# Patient Record
Sex: Female | Born: 1950 | Race: White | Hispanic: No | Marital: Married | Smoking: Never smoker
Health system: Southern US, Community
[De-identification: ages and names within clinical notes are randomized; demographics above are authoritative.]

## PROBLEM LIST (undated history)

## (undated) DIAGNOSIS — E039 Hypothyroidism, unspecified: Secondary | ICD-10-CM

## (undated) DIAGNOSIS — U071 COVID-19: Secondary | ICD-10-CM

## (undated) DIAGNOSIS — I10 Essential (primary) hypertension: Secondary | ICD-10-CM

## (undated) DIAGNOSIS — J45909 Unspecified asthma, uncomplicated: Secondary | ICD-10-CM

## (undated) DIAGNOSIS — E232 Diabetes insipidus: Secondary | ICD-10-CM

## (undated) DIAGNOSIS — E785 Hyperlipidemia, unspecified: Secondary | ICD-10-CM

## (undated) DIAGNOSIS — G4733 Obstructive sleep apnea (adult) (pediatric): Secondary | ICD-10-CM

## (undated) DIAGNOSIS — K219 Gastro-esophageal reflux disease without esophagitis: Secondary | ICD-10-CM

## (undated) DIAGNOSIS — E119 Type 2 diabetes mellitus without complications: Secondary | ICD-10-CM

## (undated) DIAGNOSIS — J449 Chronic obstructive pulmonary disease, unspecified: Secondary | ICD-10-CM

## (undated) HISTORY — DX: Essential (primary) hypertension: I10

## (undated) HISTORY — PX: OTHER SURGICAL HISTORY: SHX169

## (undated) HISTORY — DX: Hypothyroidism, unspecified: E03.9

## (undated) HISTORY — DX: Unspecified asthma, uncomplicated: J45.909

## (undated) HISTORY — DX: Gastro-esophageal reflux disease without esophagitis: K21.9

## (undated) HISTORY — DX: Obstructive sleep apnea (adult) (pediatric): G47.33

## (undated) HISTORY — DX: Type 2 diabetes mellitus without complications: E11.9

## (undated) HISTORY — PX: SHOULDER SURGERY: SHX246

## (undated) HISTORY — DX: COVID-19: U07.1

## (undated) HISTORY — DX: Diabetes insipidus: E23.2

## (undated) HISTORY — DX: Chronic obstructive pulmonary disease, unspecified: J44.9

## (undated) HISTORY — DX: Hyperlipidemia, unspecified: E78.5

---

## 2016-06-28 DIAGNOSIS — N183 Chronic kidney disease, stage 3 unspecified: Secondary | ICD-10-CM

## 2018-01-23 DIAGNOSIS — E7800 Pure hypercholesterolemia, unspecified: Secondary | ICD-10-CM

## 2018-01-23 DIAGNOSIS — K589 Irritable bowel syndrome without diarrhea: Secondary | ICD-10-CM | POA: Insufficient documentation

## 2018-01-23 DIAGNOSIS — E039 Hypothyroidism, unspecified: Secondary | ICD-10-CM | POA: Insufficient documentation

## 2018-01-23 DIAGNOSIS — G4733 Obstructive sleep apnea (adult) (pediatric): Secondary | ICD-10-CM | POA: Insufficient documentation

## 2018-01-23 DIAGNOSIS — F411 Generalized anxiety disorder: Secondary | ICD-10-CM

## 2018-01-23 DIAGNOSIS — M810 Age-related osteoporosis without current pathological fracture: Secondary | ICD-10-CM | POA: Insufficient documentation

## 2018-01-23 DIAGNOSIS — K219 Gastro-esophageal reflux disease without esophagitis: Secondary | ICD-10-CM | POA: Insufficient documentation

## 2018-01-23 HISTORY — DX: Obstructive sleep apnea (adult) (pediatric): G47.33

## 2018-02-12 DIAGNOSIS — I10 Essential (primary) hypertension: Secondary | ICD-10-CM | POA: Insufficient documentation

## 2018-02-12 HISTORY — DX: Essential (primary) hypertension: I10

## 2018-03-30 DIAGNOSIS — G8929 Other chronic pain: Secondary | ICD-10-CM | POA: Insufficient documentation

## 2018-03-30 DIAGNOSIS — E785 Hyperlipidemia, unspecified: Secondary | ICD-10-CM | POA: Insufficient documentation

## 2018-03-30 DIAGNOSIS — E232 Diabetes insipidus: Secondary | ICD-10-CM | POA: Insufficient documentation

## 2018-03-31 DIAGNOSIS — M19012 Primary osteoarthritis, left shoulder: Secondary | ICD-10-CM

## 2018-04-10 DIAGNOSIS — R0609 Other forms of dyspnea: Secondary | ICD-10-CM | POA: Insufficient documentation

## 2018-04-10 DIAGNOSIS — J330 Polyp of nasal cavity: Secondary | ICD-10-CM

## 2018-04-10 DIAGNOSIS — J4530 Mild persistent asthma, uncomplicated: Secondary | ICD-10-CM

## 2018-04-10 DIAGNOSIS — J309 Allergic rhinitis, unspecified: Secondary | ICD-10-CM

## 2018-04-10 HISTORY — DX: Other forms of dyspnea: R06.09

## 2019-01-02 DIAGNOSIS — F459 Somatoform disorder, unspecified: Secondary | ICD-10-CM | POA: Insufficient documentation

## 2019-01-23 DIAGNOSIS — I471 Supraventricular tachycardia, unspecified: Secondary | ICD-10-CM | POA: Insufficient documentation

## 2019-02-04 DIAGNOSIS — M353 Polymyalgia rheumatica: Secondary | ICD-10-CM | POA: Insufficient documentation

## 2019-03-05 DIAGNOSIS — F1320 Sedative, hypnotic or anxiolytic dependence, uncomplicated: Secondary | ICD-10-CM | POA: Insufficient documentation

## 2019-03-20 DIAGNOSIS — J449 Chronic obstructive pulmonary disease, unspecified: Secondary | ICD-10-CM | POA: Insufficient documentation

## 2020-11-27 ENCOUNTER — Other Ambulatory Visit: Payer: Self-pay

## 2020-11-27 ENCOUNTER — Ambulatory Visit (INDEPENDENT_AMBULATORY_CARE_PROVIDER_SITE_OTHER): Payer: Medicare HMO

## 2020-11-27 ENCOUNTER — Ambulatory Visit: Payer: Medicare HMO | Admitting: Internal Medicine

## 2020-11-27 VITALS — BP 126/70 | HR 80 | Temp 98.6°F | Ht 65.0 in | Wt 176.8 lb

## 2020-11-27 DIAGNOSIS — R0609 Other forms of dyspnea: Secondary | ICD-10-CM

## 2020-11-27 DIAGNOSIS — R0600 Dyspnea, unspecified: Secondary | ICD-10-CM

## 2020-11-27 LAB — CBC WITH DIFFERENTIAL/PLATELET
Basophils Absolute: 0 10*3/uL (ref 0.0–0.1)
Basophils Relative: 0.8 % (ref 0.0–3.0)
Eosinophils Absolute: 0 10*3/uL (ref 0.0–0.7)
Eosinophils Relative: 1 % (ref 0.0–5.0)
HCT: 37.7 % (ref 36.0–46.0)
Hemoglobin: 12.2 g/dL (ref 12.0–15.0)
Lymphocytes Relative: 20.8 % (ref 12.0–46.0)
Lymphs Abs: 1 10*3/uL (ref 0.7–4.0)
MCHC: 32.5 g/dL (ref 30.0–36.0)
MCV: 87 fl (ref 78.0–100.0)
Monocytes Absolute: 0.5 10*3/uL (ref 0.1–1.0)
Monocytes Relative: 9.3 % (ref 3.0–12.0)
Neutro Abs: 3.4 10*3/uL (ref 1.4–7.7)
Neutrophils Relative %: 68.1 % (ref 43.0–77.0)
Platelets: 367 10*3/uL (ref 150.0–400.0)
RBC: 4.33 Mil/uL (ref 3.87–5.11)
RDW: 15.7 % — ABNORMAL HIGH (ref 11.5–15.5)
WBC: 5 10*3/uL (ref 4.0–10.5)

## 2020-11-27 LAB — BASIC METABOLIC PANEL
BUN: 9 mg/dL (ref 6–23)
CO2: 27 mEq/L (ref 19–32)
Calcium: 9.4 mg/dL (ref 8.4–10.5)
Chloride: 98 mEq/L (ref 96–112)
Creatinine, Ser: 0.77 mg/dL (ref 0.40–1.20)
GFR: 77.45 mL/min (ref >60.00)
Glucose, Bld: 81 mg/dL (ref 70–99)
Potassium: 4.4 mEq/L (ref 3.5–5.1)
Sodium: 133 mEq/L — ABNORMAL LOW (ref 135–145)

## 2020-11-27 LAB — BRAIN NATRIURETIC PEPTIDE: Pro B Natriuretic peptide (BNP): 32 pg/mL (ref 0.0–100.0)

## 2020-11-27 LAB — SEDIMENTATION RATE: Sed Rate: 14 mm/hr (ref 0–30)

## 2020-11-27 LAB — TSH: TSH: 1.85 u[IU]/mL (ref 0.35–5.50)

## 2020-11-27 NOTE — Progress Notes (Signed)
Kayla Solomon, female    DOB: 01/01/1951,   MRN: [id number]   Brief patient profile:  70 yo  wf   never smoker  referred to pulmonary clinic 11/27/2020 by Loura Back NP for asthma eval.  Seasonal allergy around age 57 with nasal polyps /surgery 1995 [place]  and asthma in her 66's while living in [place] > dx by pulmonary as asthma but says rx trelegy helped some there but since in [facility] March 2022  breathing worse to point of sob at rest.    History of Present Illness  11/27/2020  Pulmonary/ 1st office eval/  Chief Complaint  Patient presents with   Consult    Having sob some difficult breathing.   Dyspnea:  food lion p 2 aisles  Cough: not now  Sleep: tilted up at 3-4 pillows and cpap  SABA use: not working  Ent eval Dr Suszanne Conners no change rx per pt   No obvious day to day or daytime variability or assoc excess/ purulent sputum or mucus plugs or hemoptysis or cp or chest tightness, subjective wheeze or overt sinus or hb symptoms.   Sleeping as above without nocturnal  or early am exacerbation  of respiratory  c/o's or need for noct saba. Also denies any obvious fluctuation of symptoms with weather or environmental changes or other aggravating or alleviating factors except as outlined above   No unusual exposure hx or h/o childhood pna/ asthma or knowledge of premature birth.  Current Allergies, Complete Past Medical History, Past Surgical History, Family History, and Social History were reviewed in [employer] record.  ROS  The following are not active complaints unless bolded Hoarseness, sore throat, dysphagia, dental problems, itching, sneezing,  nasal congestion or discharge of excess mucus or purulent secretions, ear ache,   fever, chills, sweats, unintended wt loss or wt gain, classically pleuritic or exertional cp,  orthopnea pnd or arm/hand swelling  or leg swelling, presyncope, palpitations, abdominal pain, anorexia, nausea, vomiting, diarrhea  or change  in bowel habits or change in bladder habits, change in stools or change in urine, dysuria, hematuria,  rash, arthralgias, visual complaints, headache, numbness, weakness or ataxia or problems with walking or coordination,  change in mood or  memory.           Past Medical History:  Diagnosis Date   Asthma    COPD (chronic obstructive pulmonary disease) (HCC)    Diabetes mellitus without complication (HCC)    GERD (gastroesophageal reflux disease)    Hyperlipidemia    Hypertension    Hypothyroidism    OSA (obstructive sleep apnea)     Outpatient Medications Prior to Visit  Medication Sig Dispense Refill   ALPRAZolam (XANAX) 0.5 MG tablet Take 0.5 mg by mouth at bedtime as needed for anxiety.     amLODipine (NORVASC) 5 MG tablet Take 5 mg by mouth daily.     atorvastatin (LIPITOR) 20 MG tablet Take 20 mg by mouth daily.     desmopressin (DDAVP) 0.1 MG tablet Take 0.1 mg by mouth 2 (two) times daily.     FLUoxetine (PROZAC) 40 MG capsule Take 40 mg by mouth daily.     Fluticasone-Umeclidin-Vilant (TRELEGY ELLIPTA) 100-62.5-25 MCG/INH AEPB Inhale 1 puff into the lungs daily.     levothyroxine (SYNTHROID) 112 MCG tablet Take 112 mcg by mouth daily before breakfast.     montelukast (SINGULAIR) 10 MG tablet Take 10 mg by mouth at bedtime.     nitroGLYCERIN (NITROSTAT) 0.4 MG  SL tablet Place 0.4 mg under the tongue every 5 (five) minutes as needed for chest pain.     pantoprazole (PROTONIX) 40 MG tablet Take 40 mg by mouth daily.     tizanidine (ZANAFLEX) 2 MG capsule Take 2 mg by mouth daily.     traZODone (DESYREL) 100 MG tablet Take 100 mg by mouth at bedtime.     albuterol (PROVENTIL HFA) 108 (90 Base) MCG/ACT inhaler Inhale 2 puffs into the lungs every 6 (six) hours as needed for wheezing or shortness of breath.     linaclotide (LINZESS) 145 MCG CAPS capsule Take 145 mcg by mouth daily before breakfast. (Patient not taking: Reported on 11/27/2020)     No facility-administered medications  prior to visit.     Objective:     BP 126/70   Pulse 80   Temp 98.6 F (37 C) (Oral)   Ht 5\' 5"  (1.651 m)   Wt 176 lb 12.8 oz (80.2 kg)   SpO2 97% Comment: ra  BMI 29.42 kg/m   SpO2: 97 % (ra)  Amb female nad / mild pseudowheeze    HEENT : pt wearing mask not removed for exam due to covid -19 concerns.    NECK :  without JVD/Nodes/TM/ nl carotid upstrokes bilaterally   LUNGS: no acc muscle use,  Nl contour chest which is clear to A and P bilaterally without cough on insp or exp maneuvers   CV:  RRR  no s3 or murmur or increase in P2, and no edema   ABD:  soft and nontender with nl inspiratory excursion in the supine position. No bruits or organomegaly appreciated, bowel sounds nl  MS:  Nl gait/ ext warm without deformities, calf tenderness, cyanosis or clubbing No obvious joint restrictions   SKIN: warm and dry without lesions    NEURO:  alert, approp, nl sensorium with  no motor or cerebellar deficits apparent.    CXR PA and Lateral:   11/27/2020 :    I personally reviewed images / impression as follows:    Cm s acute lung changes   Labs ordered/ reviewed:      Chemistry      Component Value Date/Time   NA 133 (L) 11/27/2020 1215   K 4.4 11/27/2020 1215   CL 98 11/27/2020 1215   CO2 27 11/27/2020 1215   BUN 9 11/27/2020 1215   CREATININE 0.77 11/27/2020 1215      Component Value Date/Time   CALCIUM 9.4 11/27/2020 1215        Lab Results  Component Value Date   WBC 5.0 11/27/2020   HGB 12.2 11/27/2020   HCT 37.7 11/27/2020   MCV 87.0 11/27/2020   PLT 367.0 11/27/2020     Lab Results  Component Value Date   DDIMER 0.34 11/27/2020      Lab Results  Component Value Date   TSH 1.85 11/27/2020     Lab Results  Component Value Date   PROBNP 32.0 11/27/2020       Lab Results  Component Value Date   ESRSEDRATE 14 11/27/2020            Assessment   DOE (dyspnea on exertion) Onset in her 60's in setting of h/o nasal polyps dx  as asthma some better on trelegy  -  11/27/2020   [facility] RA 3 laps @ approx 252ft each @ avg pace  stopped due to sob with sats no lower than 95%  -  11/27/2020  After extensive  coaching inhaler device,  effectiveness =   50% from a baseline near 0  Allergy profile 11/27/2020 >  Eos 0.0 /  IgE  Pending  Symptoms are markedly disproportionate to objective findings and not clear to what extent this is actually a pulmonary  problem but pt does appear to have difficult to sort out respiratory symptoms of unknown origin for which  DDX  = almost all start with A and  include Adherence, Ace Inhibitors, Acid Reflux, Active Sinus Disease, Alpha 1 Antitripsin deficiency, Anxiety masquerading as Airways dz,  ABPA,  Allergy(esp in young), Aspiration (esp in elderly), Adverse effects of meds,  Active smoking or Vaping, A bunch of PE's/clot burden (a few small clots can't cause this syndrome unless there is already severe underlying pulm or vascular dz with poor reserve),  Anemia or thyroid disorder, plus two Bs  = Bronchiectasis and Beta blocker use..and one C= CHF     Adherence is always the initial "prime suspect" and is a multilayered concern that requires a "trust but verify" approach in every patient - starting with knowing how to use medications, especially inhalers, correctly, keeping up with refills and understanding the fundamental difference between maintenance and prns vs those medications only taken for a very short course and then stopped and not refilled.  - see hfa teaching   ? Acid (or non-acid) GERD > always difficult to exclude as up to 75% of pts in some series report no assoc GI/ Heartburn symptoms> rec max (24h)  acid suppression and diet restrictions/ reviewed and instructions given in writing.   ? Adverse side effects of meds, in particular trelegy, with exam suggestive of pseudowheeze only > trial off and restart right away if getting worse ? Allergy/ asthma in setting of h/o nasal polyps.   -  nothing to suggest refractory asthma here. Will try off trelegy  As above and just use singulair and  prn saba I spent extra time with pt today reviewing appropriate use of albuterol for prn use on exertion with the following points: 1) saba is for relief of sob that does not improve by walking a slower pace or resting but rather if the pt does not improve after trying this first. 2) If the pt is convinced, as many are, that saba helps recover from activity faster then it's easy to tell if this is the case by re-challenging : ie stop, take the inhaler, then p 5 minutes try the exact same activity (intensity of workload) that just caused the symptoms and see if they are substantially diminished or not after saba 3) if there is an activity that reproducibly causes the symptoms, try the saba 15 min before the activity on alternate days   If in fact the saba really does help, then fine to continue to use it prn but advised may need to look closer at the maintenance regimen being used to achieve better control of airways disease with exertion.    ? Anemia / thyroid disoder > ruled out  ? Anxiety/depression/ deconditioning  > usually at the bottom of this list of usual suspects but   note already on psychotropics and may interfere with adherence and also interpretation of response or lack thereof to symptom management which can be quite subjective.   ?  A bunch of PE's > D dimer nl - while a normal  or high normal value (seen commonly in the elderly or chronically ill)  may miss small peripheral pe, the clot burden with  sob is moderately high and the d dimer  has a very high neg pred value if used in this setting.    ? chf > excluded by bnp so low though does have mild CM on cxr    Each maintenance medication was reviewed in detail including emphasizing most importantly the difference between maintenance and prns and under what circumstances the prns are to be triggered using an action plan format where  appropriate.  Total time for H and P, chart review, counseling, reviewing hfa device(s) , directly observing portions of ambulatory 02 saturation study/ and generating customized AVS unique to this office visit / same day charting =51 min                   Sandrea Hughs, MD 11/27/2020

## 2020-11-27 NOTE — Patient Instructions (Addendum)
Only use your albuterol as a rescue medication to be used if you can't catch your breath by resting or doing a relaxed purse lip breathing pattern.  - The less you use it, the better it will work when you need it. - Ok to use up to 2 puffs  every 4 hours if you must but call for immediate appointment if use goes up over your usual need - Don't leave home without it !!  (think of it like the spare tire for your car)   Also ok to Try albuterol 15 min before an activity (on alternating days)  that you know would make you short of breath and see if it makes any difference and if makes none then don't take albuterol after activity unless you can't catch your breath as this means it's the resting that helps, not the albuterol.      Work on inhaler technique:  relax and gently blow all the way out then take a nice smooth full deep breath back in, triggering the inhaler at same time you start breathing in.   Blow out thru nose. Rinse and gargle with water when done.  If mouth or throat bother you at all,  try brushing teeth/gums/tongue with arm and hammer toothpaste/ make a slurry and gargle and spit out.   Ok to leave off trelegy  Please remember to go to the lab and x-ray department  for your tests - we will call you with the results when they are available.       Please schedule a follow up office visit in 6 weeks in 6 weeks -no inhalers that day

## 2020-11-29 ENCOUNTER — Encounter: Payer: Self-pay | Admitting: Internal Medicine

## 2020-11-29 NOTE — Assessment & Plan Note (Addendum)
Onset in her 60's in setting of h/o nasal polyps dx as asthma some better on trelegy  -  11/27/2020   [facility] RA 3 laps @ approx 254ft each @ avg pace  stopped due to sob with sats no lower than 95%  -  11/27/2020  After extensive coaching inhaler device,  effectiveness =   50% from a baseline near 0  Allergy profile 11/27/2020 >  Eos 0.0 /  IgE  Pending  Symptoms are markedly disproportionate to objective findings and not clear to what extent this is actually a pulmonary  problem but pt does appear to have difficult to sort out respiratory symptoms of unknown origin for which  DDX  = almost all start with A and  include Adherence, Ace Inhibitors, Acid Reflux, Active Sinus Disease, Alpha 1 Antitripsin deficiency, Anxiety masquerading as Airways dz,  ABPA,  Allergy(esp in young), Aspiration (esp in elderly), Adverse effects of meds,  Active smoking or Vaping, A bunch of PE's/clot burden (a few small clots can't cause this syndrome unless there is already severe underlying pulm or vascular dz with poor reserve),  Anemia or thyroid disorder, plus two Bs  = Bronchiectasis and Beta blocker use..and one C= CHF     Adherence is always the initial "prime suspect" and is a multilayered concern that requires a "trust but verify" approach in every patient - starting with knowing how to use medications, especially inhalers, correctly, keeping up with refills and understanding the fundamental difference between maintenance and prns vs those medications only taken for a very short course and then stopped and not refilled.  - see hfa teaching   ? Acid (or non-acid) GERD > always difficult to exclude as up to 75% of pts in some series report no assoc GI/ Heartburn symptoms> rec max (24h)  acid suppression and diet restrictions/ reviewed and instructions given in writing.   ? Adverse side effects of meds, in particular trelegy, with exam suggestive of pseudowheeze only > trial off and restart right away if getting worse ?  Allergy/ asthma in setting of h/o nasal polyps.   - nothing to suggest refractory asthma here. Will try off trelegy  As above and just use singulair and  prn saba I spent extra time with pt today reviewing appropriate use of albuterol for prn use on exertion with the following points: 1) saba is for relief of sob that does not improve by walking a slower pace or resting but rather if the pt does not improve after trying this first. 2) If the pt is convinced, as many are, that saba helps recover from activity faster then it's easy to tell if this is the case by re-challenging : ie stop, take the inhaler, then p 5 minutes try the exact same activity (intensity of workload) that just caused the symptoms and see if they are substantially diminished or not after saba 3) if there is an activity that reproducibly causes the symptoms, try the saba 15 min before the activity on alternate days   If in fact the saba really does help, then fine to continue to use it prn but advised may need to look closer at the maintenance regimen being used to achieve better control of airways disease with exertion.    ? Anemia / thyroid disoder > ruled out  ? Anxiety/depression/ deconditioning  > usually at the bottom of this list of usual suspects but   note already on psychotropics and may interfere with adherence and also interpretation  of response or lack thereof to symptom management which can be quite subjective.   ?  A bunch of PE's > D dimer nl - while a normal  or high normal value (seen commonly in the elderly or chronically ill)  may miss small peripheral pe, the clot burden with sob is moderately high and the d dimer  has a very high neg pred value if used in this setting.    ? chf > excluded by bnp so low though does have mild CM on cxr    Each maintenance medication was reviewed in detail including emphasizing most importantly the difference between maintenance and prns and under what circumstances the prns  are to be triggered using an action plan format where appropriate.  Total time for H and P, chart review, counseling, reviewing hfa device(s) , directly observing portions of ambulatory 02 saturation study/ and generating customized AVS unique to this office visit / same day charting =51 min

## 2020-11-30 ENCOUNTER — Encounter: Payer: Self-pay | Admitting: *Deleted

## 2020-11-30 LAB — IGE: IgE (Immunoglobulin E), Serum: 16 kU/L (ref <=114)

## 2020-11-30 LAB — ALPHA-1-ANTITRYPSIN: A-1 Antitrypsin, Ser: 169 mg/dL (ref 83–199)

## 2020-11-30 LAB — D-DIMER, QUANTITATIVE: D-Dimer, Quant: 0.34 mcg/mL FEU (ref <0.50)

## 2020-12-01 ENCOUNTER — Encounter: Payer: Self-pay | Admitting: *Deleted

## 2020-12-28 NOTE — Progress Notes (Signed)
Cardiology Office Note:   Date:  12/29/2020  NAME:  Kayla Solomon    MRN: [id number] DOB:  12/03/1950   PCP:  Loura Back, NP  Cardiologist:  None  Electrophysiologist:  None   Referring MD: Loura Back, NP   Chief Complaint  Patient presents with   Fatigue    History of Present Illness:   Kayla Solomon is a 70 y.o. female with a hx of asthma, DM, GERD, HTN, HLD who is being seen today for the evaluation of fatigue at the request of Loura Back, NP.  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  She also has sleep apnea and hypertension.  She recently relocated to [facility] from [place].  She apparently recently lost a daughter.  Her oldest daughter died in 2022/08/08 of this year.  They moved to [place] shortly after.  She reports it has been tough and she is likely depressed.  She informs me she has no chest pain or pressure.  She is not active.  Her EKG demonstrates sinus rhythm with nonspecific ST-T changes.  She has never had a heart attack or stroke.  She has followed with cardiology in [place] in the past.  She underwent an echocardiogram in 2020/04/15that showed an LV EF of 55-60% with grade 1 diastolic dysfunction.  She had a stress test in 2018 that demonstrated a small fixed inferior defect that was considered low risk and likely artifact.  She informs me she is not exercising.  Her blood pressure is well controlled.  She is a never smoker.  She does not drink alcohol or use drugs.  She also informs me that she has another daughter who lives with her and her husband.  Apparently this child has mental illness.  Apparently it is quite stressful taking care of her.  She is not sleeping well either.  She reports no appetite.  I suspect her symptoms are likely related to depression.  Her primary care physician has requested a cardiac evaluation which is quite reasonable.  She does report a strong family history of heart disease in her father as well as maternal uncles.  Her symptoms of  fatigue appear to have occurred since 04-15-22to coincide with the death of her daughter.  Low activity but reports no chest pain or shortness of breath in office today.  Problem List Asthma GERD HTN HLD -T chol 159, HDL 55, LDL 85, TG 98 Diabetes insipidus  OSA  Past Medical History: Past Medical History:  Diagnosis Date   Asthma    COPD (chronic obstructive pulmonary disease) (HCC)    Diabetes insipidus (HCC)    Diabetes mellitus without complication (HCC)    GERD (gastroesophageal reflux disease)    Hyperlipidemia    Hypertension    Hypothyroidism    OSA (obstructive sleep apnea)     Past Surgical History: Past Surgical History:  Procedure Laterality Date   SHOULDER SURGERY     ulnar surgery      Current Medications: Current Meds  Medication Sig   ALPRAZolam (XANAX) 0.5 MG tablet Take 0.5 mg by mouth at bedtime as needed for anxiety.   amLODipine (NORVASC) 5 MG tablet Take 5 mg by mouth daily.   atorvastatin (LIPITOR) 20 MG tablet Take 20 mg by mouth daily.   desmopressin (DDAVP) 0.1 MG tablet Take 0.1 mg by mouth 2 (two) times daily.   FLUoxetine (PROZAC) 40 MG capsule Take 40 mg by mouth daily.   levothyroxine (SYNTHROID) 112 MCG tablet  Take 112 mcg by mouth daily before breakfast.   montelukast (SINGULAIR) 10 MG tablet Take 10 mg by mouth at bedtime.   nitroGLYCERIN (NITROSTAT) 0.4 MG SL tablet Place 0.4 mg under the tongue every 5 (five) minutes as needed for chest pain.   pantoprazole (PROTONIX) 40 MG tablet Take 40 mg by mouth daily.   tizanidine (ZANAFLEX) 2 MG capsule Take 2 mg by mouth daily.   traZODone (DESYREL) 100 MG tablet Take 100 mg by mouth at bedtime.     Allergies:    Patient has no known allergies.   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  Tobacco Use   Smoking status: Never   Smokeless tobacco: Never  Substance and Sexual Activity   Alcohol use: Not Currently   Drug use: Never   Sexual activity: Not Currently  Other Topics Concern   Not on file  Social History Narrative   Not on file   Social Determinants of Health   Financial Resource Strain: Not on file  Food Insecurity: Not on file  Transportation Needs: Not on file  Physical Activity: Not on file  Stress: Not on file  Social Connections: Not on file     Family History: The patient's family history includes Heart attack in her maternal aunt; Heart attack (age of onset: 63) in her father; Stroke in her maternal uncle.  ROS:   All other ROS reviewed and negative. Pertinent positives noted in the HPI.     EKGs/Labs/Other Studies Reviewed:   The following studies were personally reviewed by me today:  EKG:  EKG is ordered today.  The ekg ordered today demonstrates sinus bradycardia heart rate 52, anterior T wave inversions noted, and was personally reviewed by me.   Recent Labs: 11/27/2020: BUN 9; Creatinine, Ser 0.77; Hemoglobin 12.2; Platelets 367.0; Potassium 4.4; Pro B Natriuretic peptide (BNP) 32.0; Sodium 133; TSH 1.85   Recent Lipid Panel No results found for: CHOL, TRIG, HDL, CHOLHDL, VLDL, LDLCALC, LDLDIRECT  Physical Exam:   VS:  BP 122/76   Pulse (!) 52   Ht 5\' 5"  (1.651 m)   Wt 178 lb 12.8 oz (81.1 kg)   SpO2 98%   BMI 29.75 kg/m    Wt Readings from Last 3 Encounters:  12/29/20 178 lb 12.8 oz (81.1 kg)  11/27/20 176 lb 12.8 oz (80.2 kg)    General: Well nourished, well developed, in no acute distress Head: Atraumatic, normal size  Eyes: PEERLA, EOMI  Neck: Supple, no JVD Endocrine: No thryomegaly Cardiac: Normal S1, S2; RRR; no murmurs, rubs, or gallops Lungs: Clear to auscultation bilaterally, no wheezing, rhonchi or rales  Abd: Soft, nontender, no hepatomegaly  Ext: No edema, pulses  2+ Musculoskeletal: No deformities, BUE and BLE strength normal and equal Skin: Warm and dry, no rashes   Neuro: Alert and oriented to person, place, time, and situation, CNII-XII grossly intact, no focal deficits  Psych: Normal mood and affect   ASSESSMENT:   Kayla Solomon is a 70 y.o. female who presents for the following: 1. Chronic fatigue     PLAN:   1. Chronic fatigue -reports fatigue and poor sleep.  This is coincided with the death of her daughter in Jul 29, 2020.  She also cares for her other daughter who has mental illness.  She reports it is tough and stressful.  She is had a stress test and echocardiogram in [place] that were normal.  Her EKG today in office demonstrates sinus bradycardia with nonspecific ST-T changes.  She informs me she has no angina or chest pressure.  She did report this with her primary care physician.  Really unclear if she has a history of ischemia.  Her stress test shows no evidence of this and I suspect this is artifact from the review of records and [place]. -Although I suspect her symptoms are related to depression, I would recommend we exclude any coronary disease with a Lexi nuclear medicine stress.  We will combine this with a coronary calcium score.  This will help further risk stratify her.  We will plan to see her yearly and I will notify her of her results by phone.  Shared Decision Making/Informed Consent The risks [chest pain, shortness of breath, cardiac arrhythmias, dizziness, blood pressure fluctuations, myocardial infarction, stroke/transient ischemic attack, nausea, vomiting, allergic reaction, radiation exposure, metallic taste sensation and life-threatening complications (estimated to be 1 in 10,000)], benefits (risk stratification, diagnosing coronary artery disease, treatment guidance) and alternatives of a nuclear stress test were discussed in detail with Ms. Stancil and she agrees to proceed.  Disposition: Return in about 1 year (around  12/29/2021).  Medication Adjustments/Labs and Tests Ordered: Current medicines are reviewed at length with the patient today.  Concerns regarding medicines are outlined above.  Orders Placed This Encounter  Procedures   CT CARDIAC SCORING (SELF PAY ONLY)   Cardiac Stress Test: Informed Consent Details: Physician/Practitioner Attestation; Transcribe to consent form and obtain patient signature   MYOCARDIAL PERFUSION IMAGING   EKG 12-Lead    No orders of the defined types were placed in this encounter.   Patient Instructions  Medication Instructions:  The current medical regimen is effective;  continue present plan and medications.  *If you need a refill on your cardiac medications before your next appointment, please call your pharmacy*   Testing/Procedures: Your physician has requested that you have a lexiscan myoview. For further information please visit [email or web address] Please follow instruction sheet, as given.  CALCIUM SCORE  Follow-Up: At [facility], you and your health needs are our priority.  As part of our continuing mission to provide you with exceptional heart care, we have created designated Provider Care Teams.  These Care Teams include your primary Cardiologist (physician) and Advanced Practice Providers (APPs -  Physician Assistants and Nurse Practitioners) who all work together to provide you with the care you need, when you need it.  We recommend signing up for the patient portal called "MyChart".  Sign up information is provided on this After Visit Summary.  MyChart is used to connect with patients for Virtual Visits (Telemedicine).  Patients are able to view lab/test results, encounter notes, upcoming appointments, etc.  Non-urgent messages can be sent to your provider as well.   To learn more about what you can do with MyChart, go to [email or web address].    Your next appointment:   12 month(s)  The format for your next appointment:   In  Person  Provider:   Lennie Odor, MD      Signed, Lenna Gilford. Flora Lipps, MD, [facility]  [facility]  [address] [place], [place] [address] [phone]  12/29/2020 3:58 PM

## 2020-12-29 ENCOUNTER — Ambulatory Visit: Payer: Medicare HMO | Admitting: Cardiovascular Disease

## 2020-12-29 ENCOUNTER — Encounter: Payer: Self-pay | Admitting: Cardiovascular Disease

## 2020-12-29 ENCOUNTER — Other Ambulatory Visit: Payer: Self-pay

## 2020-12-29 VITALS — BP 122/76 | HR 52 | Ht 65.0 in | Wt 178.8 lb

## 2020-12-29 DIAGNOSIS — R5382 Chronic fatigue, unspecified: Secondary | ICD-10-CM | POA: Diagnosis not present

## 2020-12-29 DIAGNOSIS — R072 Precordial pain: Secondary | ICD-10-CM

## 2020-12-29 NOTE — Patient Instructions (Signed)
Medication Instructions:  The current medical regimen is effective;  continue present plan and medications.  *If you need a refill on your cardiac medications before your next appointment, please call your pharmacy*   Testing/Procedures: Your physician has requested that you have a lexiscan myoview. For further information please visit [email or web address] Please follow instruction sheet, as given.  CALCIUM SCORE  Follow-Up: At [facility], you and your health needs are our priority.  As part of our continuing mission to provide you with exceptional heart care, we have created designated Provider Care Teams.  These Care Teams include your primary Cardiologist (physician) and Advanced Practice Providers (APPs -  Physician Assistants and Nurse Practitioners) who all work together to provide you with the care you need, when you need it.  We recommend signing up for the patient portal called "MyChart".  Sign up information is provided on this After Visit Summary.  MyChart is used to connect with patients for Virtual Visits (Telemedicine).  Patients are able to view lab/test results, encounter notes, upcoming appointments, etc.  Non-urgent messages can be sent to your provider as well.   To learn more about what you can do with MyChart, go to [email or web address].    Your next appointment:   12 month(s)  The format for your next appointment:   In Person  Provider:   Lennie Odor, MD

## 2020-12-31 ENCOUNTER — Encounter: Payer: Self-pay | Admitting: Physical Therapy

## 2020-12-31 ENCOUNTER — Other Ambulatory Visit: Payer: Self-pay

## 2020-12-31 ENCOUNTER — Ambulatory Visit: Payer: Medicare HMO | Attending: Neurology | Admitting: Physical Therapy

## 2020-12-31 DIAGNOSIS — R42 Dizziness and giddiness: Secondary | ICD-10-CM | POA: Diagnosis present

## 2020-12-31 DIAGNOSIS — R2681 Unsteadiness on feet: Secondary | ICD-10-CM | POA: Diagnosis present

## 2020-12-31 DIAGNOSIS — R262 Difficulty in walking, not elsewhere classified: Secondary | ICD-10-CM | POA: Diagnosis present
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
## 2021-01-01 ENCOUNTER — Telehealth (HOSPITAL_COMMUNITY): Payer: Self-pay | Admitting: *Deleted

## 2021-01-01 NOTE — Telephone Encounter (Signed)
Close encounter 

## 2021-01-06 ENCOUNTER — Ambulatory Visit (HOSPITAL_COMMUNITY)
Admission: RE | Admit: 2021-01-06 | Discharge: 2021-01-06 | Disposition: A | Discharge status: Home / Self Care | Payer: Medicare HMO | Source: Ambulatory Visit | Attending: Cardiovascular Disease | Admitting: Cardiovascular Disease

## 2021-01-06 ENCOUNTER — Other Ambulatory Visit: Payer: Self-pay

## 2021-01-06 DIAGNOSIS — R5382 Chronic fatigue, unspecified: Secondary | ICD-10-CM

## 2021-01-06 DIAGNOSIS — I10 Essential (primary) hypertension: Secondary | ICD-10-CM | POA: Insufficient documentation

## 2021-01-06 DIAGNOSIS — E039 Hypothyroidism, unspecified: Secondary | ICD-10-CM | POA: Diagnosis not present

## 2021-01-06 DIAGNOSIS — I2510 Atherosclerotic heart disease of native coronary artery without angina pectoris: Secondary | ICD-10-CM | POA: Diagnosis not present

## 2021-01-06 DIAGNOSIS — R001 Bradycardia, unspecified: Secondary | ICD-10-CM | POA: Diagnosis not present

## 2021-01-06 DIAGNOSIS — R0609 Other forms of dyspnea: Secondary | ICD-10-CM | POA: Insufficient documentation

## 2021-01-06 DIAGNOSIS — J449 Chronic obstructive pulmonary disease, unspecified: Secondary | ICD-10-CM | POA: Insufficient documentation

## 2021-01-06 DIAGNOSIS — G4733 Obstructive sleep apnea (adult) (pediatric): Secondary | ICD-10-CM | POA: Insufficient documentation

## 2021-01-06 DIAGNOSIS — I252 Old myocardial infarction: Secondary | ICD-10-CM | POA: Diagnosis not present

## 2021-01-06 DIAGNOSIS — Z8249 Family history of ischemic heart disease and other diseases of the circulatory system: Secondary | ICD-10-CM | POA: Diagnosis not present

## 2021-01-06 LAB — MYOCARDIAL PERFUSION IMAGING
LV dias vol: 100 mL (ref 46–106)
LV sys vol: 26 mL
Nuc Stress EF: 74 %
Peak HR: 76 {beats}/min
Rest HR: 53 {beats}/min
Rest Nuclear Isotope Dose: 10.2 mCi
SDS: 0
SRS: 1
SSS: 1
ST Depression (mm): 0 mm
Stress Nuclear Isotope Dose: 28.7 mCi
TID: 1.04

## 2021-01-06 MED ORDER — TECHNETIUM TC 99M TETROFOSMIN IV KIT
10.2000 | PACK | Freq: Once | INTRAVENOUS | Status: AC | PRN
  Administered 2021-01-06: 10.2 via INTRAVENOUS
  Filled 2021-01-06: qty 11

## 2021-01-06 MED ORDER — REGADENOSON 0.4 MG/5ML IV SOLN
0.4000 mg | Freq: Once | INTRAVENOUS | Status: AC
  Administered 2021-01-06: 0.4 mg via INTRAVENOUS

## 2021-01-06 MED ORDER — AMINOPHYLLINE 25 MG/ML IV SOLN
75.0000 mg | Freq: Once | INTRAVENOUS | Status: AC
  Administered 2021-01-06: 75 mg via INTRAVENOUS

## 2021-01-06 MED ORDER — TECHNETIUM TC 99M TETROFOSMIN IV KIT
28.7000 | PACK | Freq: Once | INTRAVENOUS | Status: AC | PRN
  Administered 2021-01-06: 28.7 via INTRAVENOUS
  Filled 2021-01-06: qty 29

## 2021-01-12 ENCOUNTER — Ambulatory Visit (INDEPENDENT_AMBULATORY_CARE_PROVIDER_SITE_OTHER)
Admission: RE | Admit: 2021-01-12 | Discharge: 2021-01-12 | Disposition: A | Discharge status: Home / Self Care | Payer: Self-pay | Source: Ambulatory Visit | Attending: Cardiovascular Disease | Admitting: Cardiovascular Disease

## 2021-01-12 ENCOUNTER — Other Ambulatory Visit: Payer: Self-pay

## 2021-01-12 DIAGNOSIS — R5382 Chronic fatigue, unspecified: Secondary | ICD-10-CM

## 2021-01-14 ENCOUNTER — Other Ambulatory Visit: Payer: Self-pay

## 2021-01-14 ENCOUNTER — Ambulatory Visit (INDEPENDENT_AMBULATORY_CARE_PROVIDER_SITE_OTHER): Payer: Medicare HMO | Admitting: Pulmonary Disease

## 2021-01-14 ENCOUNTER — Encounter: Payer: Self-pay | Admitting: Pulmonary Disease

## 2021-01-14 DIAGNOSIS — G4733 Obstructive sleep apnea (adult) (pediatric): Secondary | ICD-10-CM | POA: Diagnosis not present

## 2021-01-14 DIAGNOSIS — R0600 Dyspnea, unspecified: Secondary | ICD-10-CM | POA: Diagnosis not present

## 2021-01-14 DIAGNOSIS — Z9989 Dependence on other enabling machines and devices: Secondary | ICD-10-CM

## 2021-01-14 DIAGNOSIS — R0609 Other forms of dyspnea: Secondary | ICD-10-CM

## 2021-01-14 NOTE — Patient Instructions (Signed)
  We will try to track down-your-old sleep study We will send in a prescription for auto CPAP 5 to 15 cm to DME Prescription for new nasal mask

## 2021-01-14 NOTE — Progress Notes (Signed)
Subjective:    Patient ID: Kayla Solomon, female    DOB: 1950-11-06, 70 y.o.   MRN: [id number]  HPI  Chief Complaint  Patient presents with   Consult    OSA x 10 years.  Last sleep study in 2019.  Needs a new machine.  Machine still currently works, but is not in good shape.  Headgear is breaking down.  Has a nasal mask.   70 year old never smoker with asthma referred for evaluation of obstructive sleep apnea  Seasonal allergy around age 70 with nasal polyps /surgery 1995 [place]  and asthma in her 44's while living in [place] > dx by pulmonary as asthma but says rx trelegy helped some there but since in [facility] March 2022  breathing has improved and taken off Trelegy.  OSA was diagnosed in 2013 and she was placed on auto CPAP 5 to 15 cm with good results and improvement in her daytime somnolence and fatigue.  I reviewed download from 2017 which shows average pressure of 12 to 13 cm and good compliance about 7 hours per night.  She had a repeat sleep study in 2019 at [facility] sleep center in [place].  I do not have a copy of these results.  Her DME was [facility] initially and then she changed to preferred home [facility]. She wonders if she can get a new machine now since her machine is old.  Epworth sleepiness score is 4. Bedtime is between 11 PM and midnight, sleep latency is minimal, she sleeps on her right side with 2-3 pillows and rolls over on her back during the night.  Reports 2-3 nocturnal awakenings and she is out of bed latest by 9 AM feeling rested with dryness of mouth but denies headaches. 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  Another daughter was a Child psychotherapist and passed away unexpectedly.     Past Medical History:   Diagnosis Date   Asthma    COPD (chronic obstructive pulmonary disease) (HCC)    Diabetes insipidus (HCC)    Diabetes mellitus without complication (HCC)    GERD (gastroesophageal reflux disease)    Hyperlipidemia    Hypertension    Hypothyroidism    OSA (obstructive sleep apnea)    Past Surgical History:  Procedure Laterality Date   SHOULDER SURGERY     ulnar surgery      No Known Allergies  Social History   Socioeconomic History   Marital status: Married    Spouse name: Not on file   Number of children: 1   Years of education: Not on file   Highest education level: Not on file  Occupational History   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  Tobacco Use   Smoking status: Never   Smokeless tobacco: Never  Substance and Sexual Activity   Alcohol use: Not Currently   Drug use: Never   Sexual activity: Not Currently  Other Topics Concern   Not on file  Social History Narrative   Not on file   Social Determinants of Health   Financial Resource Strain: Not on file  Food Insecurity: Not on file  Transportation Needs: Not on file  Physical Activity: Not on file  Stress: Not on file  Social Connections: Not on file  Intimate  Partner Violence: Not on file    Family History  Problem Relation Age of Onset   Heart attack Father 77   Heart attack Maternal Aunt    Stroke Maternal Uncle       Review of Systems Shortness of breath with activity Indigestion Headaches Nasal congestion Anxiety and depression  Constitutional: negative for anorexia, fevers and sweats  Eyes: negative for irritation, redness and visual disturbance  Ears, nose, mouth, throat, and face: negative for earaches, epistaxis, nasal congestion and sore throat  Respiratory: negative for cough, sputum and wheezing  Cardiovascular: negative for chest pain, lower extremity edema, orthopnea, palpitations and syncope  Gastrointestinal: negative for abdominal pain, constipation, diarrhea,  melena, nausea and vomiting  Genitourinary:negative for dysuria, frequency and hematuria  Hematologic/lymphatic: negative for bleeding, easy bruising and lymphadenopathy  Musculoskeletal:negative for arthralgias, muscle weakness and stiff joints  Neurological: negative for coordination problems, gait problems, headaches and weakness  Endocrine: negative for diabetic symptoms including polydipsia, polyuria and weight loss     Objective:   Physical Exam  Gen. Pleasant, obese, in no distress, normal affect ENT - no pallor,icterus, no post nasal drip, class 2-3 airway Neck: No JVD, no thyromegaly, no carotid bruits Lungs: no use of accessory muscles, no dullness to percussion, decreased without rales or rhonchi  Cardiovascular: Rhythm regular, heart sounds  normal, no murmurs or gallops, no peripheral edema Abdomen: soft and non-tender, no hepatosplenomegaly, BS normal. Musculoskeletal: No deformities, no cyanosis or clubbing Neuro:  alert, non focal, no tremors       Assessment & Plan:

## 2021-01-14 NOTE — Assessment & Plan Note (Signed)
She has stopped taking Trelegy for more than a month and appears to be doing okay. She has not needed albuterol frequently, denies wheezing or nocturnal symptoms

## 2021-01-14 NOTE — Assessment & Plan Note (Signed)
We will attempt to track down her sleep study. Will send in prescription for new auto CPAP 5 to 15 cm.  Once she has obtained this we will review download and tweak settings as required, and the remote past she seems to report an average pressure of 12 to 13 cm. We will also send in prescription so that she can obtain a new nasal mask , current mask is an AirFit N 20 and appears to be cumbersome  Weight loss encouraged, compliance with goal of at least 4-6 hrs every night is the expectation. Advised against medications with sedative side effects Cautioned against driving when sleepy - understanding that sleepiness will vary on a day to day basis

## 2021-01-19 ENCOUNTER — Telehealth: Payer: Self-pay | Admitting: Pulmonary Disease

## 2021-01-19 NOTE — Telephone Encounter (Signed)
NPSG 04/2015 mild , AHI 8/h

## 2021-01-20 ENCOUNTER — Ambulatory Visit: Payer: Medicare HMO | Admitting: Internal Medicine

## 2021-01-20 ENCOUNTER — Encounter: Payer: Self-pay | Admitting: Internal Medicine

## 2021-01-20 ENCOUNTER — Other Ambulatory Visit: Payer: Self-pay

## 2021-01-20 DIAGNOSIS — R0609 Other forms of dyspnea: Secondary | ICD-10-CM

## 2021-01-20 DIAGNOSIS — R0600 Dyspnea, unspecified: Secondary | ICD-10-CM | POA: Diagnosis not present

## 2021-01-20 NOTE — Addendum Note (Signed)
Addended by: Jacques Earthly on: 01/20/2021 03:01 PM   Modules accepted: Orders

## 2021-01-20 NOTE — Assessment & Plan Note (Addendum)
Onset in her 60's in setting of h/o nasal polyps dx as asthma ? some better on trelegy while in [place] -  11/27/2020   Walked RA 3 laps @ approx 274ft each @ avg pace  stopped due to sob with sats no lower than 95%  -  11/27/2020  After extensive coaching inhaler device,  effectiveness =   50% from a baseline near 0  Allergy profile 11/27/2020 >  Eos 0.0 /  IgE 16   11/27/20  : alpha one AT level = 169   - .01/20/2021 no wore off trelegy x >  1 month   - 01/20/2021   Walked on RA x  2  lap(s) =  approx 500 @ fast pace, stopped due to end of study s sob  with lowest 02 sats 95% - PFT's rec 01/20/2021  As well as bed blocks / continued GERD rx see avs for instructions unique to this ov     No evidence of a lung problem here, needs pfts and continued ent input ? Refer to Voice center at [facility] if her symptoms remain refractory to ENT input locally   Discussed in detail all the  indications, usual  risks and alternatives  relative to the benefits with patient who agrees to proceed with w/u as outlined.     Pulmonary f/u is prn    Each maintenance medication was reviewed in detail including emphasizing most importantly the difference between maintenance and prns and under what circumstances the prns are to be triggered using an action plan format where appropriate.  Total time for H and P, chart review, counseling,  directly observing portions of ambulatory 02 saturation study/ and generating customized AVS unique to this office visit / same day charting = 31 min

## 2021-01-20 NOTE — Patient Instructions (Addendum)
Pantoprazole should be Take 30- 60 min before your first and last meals of the day   GERD (REFLUX)  is an extremely common cause of respiratory symptoms just like yours , many times with no obvious heartburn at all.    It can be treated with medication, but also with lifestyle changes including elevation of the head of your bed (ideally with 6 -8inch blocks under the headboard of your bed),  Smoking cessation, avoidance of late meals, excessive alcohol, and avoid fatty foods, chocolate, peppermint, colas, red wine, and acidic juices such as orange juice.  NO MINT OR MENTHOL PRODUCTS SO NO COUGH DROPS  USE SUGARLESS CANDY INSTEAD (Jolley ranchers or Stover's or Life Savers) or even ice chips will also do - the key is to swallow to prevent all throat clearing. NO OIL BASED VITAMINS - use powdered substitutes.  Avoid fish oil when coughing.   We will walk you around the office today for a new baseline off all respiratory medications   We will set you up for a lung function test and I will call you with the results   Dr Harriette Ohara is the expect on voice disorders ([facility])  Pulmonary follow up is as needed

## 2021-01-20 NOTE — Progress Notes (Signed)
Kayla Solomon, female    DOB: 1951-03-02,   MRN: [id number]   Brief patient profile:  70 yowf   never smoker  referred to pulmonary clinic 11/27/2020 by Loura Back NP for asthma eval.  Seasonal allergy around age 70 with nasal polyps /surgery 1995 [place]  and asthma in her 70's while living in [place] > dx by pulmonary as asthma but says rx trelegy helped some there but since in [facility] March 2022  breathing worse to point of sob at rest.    History of Present Illness  11/27/2020  Pulmonary/ 1st office eval/  Chief Complaint  Patient presents with   Consult    Having sob some difficult breathing.   Dyspnea:  food [employer] p 2 aisles  Cough: not now  Sleep: tilted up at 3-4 pillows and cpap  SABA use: not working  Ent eval Dr Suszanne Conners no change rx per pt   Rec Only use your albuterol as a rescue medication  Also ok to Try albuterol 15 min before an activity (on alternating days)  that you know would make you short of breath  Work on inhaler technique:   Ok to leave off trelegy    01/20/2021  f/u ov/ re: doe ?  why   maint on no longer on trelegy > no change doe   Chief Complaint  Patient presents with   Follow-up   Dyspnea: sev aisles at [employer]  Cough: some daytime  pnds sensation but not noct  Sleeping: fine on cpap [place]  SABA use: none  02: none  Covid status:   vax x 4    No obvious day to day or daytime variability or assoc excess/ purulent sputum or mucus plugs or hemoptysis or cp or chest tightness, subjective wheeze or overt sinus or hb symptoms.   Sleeping on cpap  without nocturnal  or early am exacerbation  of respiratory  c/o's or need for noct saba. Also denies any obvious fluctuation of symptoms with weather or environmental changes or other aggravating or alleviating factors except as outlined above   No unusual exposure hx or h/o childhood pna/ asthma or knowledge of premature birth.  Current Allergies, Complete Past Medical History, Past Surgical History,  Family History, and Social History were reviewed in [employer] record.  ROS  The following are not active complaints unless bolded Hoarseness, sore throat, dysphagia, dental problems, itching, sneezing,  nasal congestion or discharge of excess mucus or purulent secretions, ear ache,   fever, chills, sweats, unintended wt loss or wt gain, classically pleuritic or exertional cp,  orthopnea pnd or arm/hand swelling  or leg swelling, presyncope, palpitations, abdominal pain, anorexia, nausea, vomiting, diarrhea  or change in bowel habits or change in bladder habits, change in stools or change in urine, dysuria, hematuria,  rash, arthralgias, visual complaints, headache, numbness, weakness or ataxia or problems with walking or coordination,  change in mood or  memory.        Current Meds  Medication Sig   ALPRAZolam (XANAX) 0.5 MG tablet Take 0.5 mg by mouth at bedtime as needed for anxiety.   amLODipine (NORVASC) 5 MG tablet Take 5 mg by mouth daily.   Ascorbic Acid (VITAMIN C) 1000 MG tablet Take 1,000 mg by mouth daily.   aspirin 81 MG EC tablet    atorvastatin (LIPITOR) 20 MG tablet Take 20 mg by mouth daily.   Biotin 78295 MCG TABS Take 1 capsule by mouth daily.   Calcium 600-200  MG-UNIT tablet Take 2 tablets by mouth daily. Minerals 600 mg Calcium 200 unit tablet   Cholecalciferol (VITAMIN D3) 1.25 MG (50000 UT) CAPS Take 2,000 Units by mouth daily. 2 tablets daily   Cobalamin Combinations (B-12) [phone] MCG SUBL Take 5,000 mcg by mouth daily. 2 daily   desmopressin (DDAVP) 0.1 MG tablet Take 0.1 mg by mouth 2 (two) times daily.   ferrous sulfate 325 (65 FE) MG tablet Take 325 mg by mouth daily with breakfast.   FLUoxetine (PROZAC) 40 MG capsule Take 40 mg by mouth daily.   levothyroxine (SYNTHROID) 112 MCG tablet Take 112 mcg by mouth daily before breakfast.   montelukast (SINGULAIR) 10 MG tablet Take 10 mg by mouth at bedtime.   nitroGLYCERIN (NITROSTAT) 0.4 MG SL  tablet Place 0.4 mg under the tongue every 5 (five) minutes as needed for chest pain.   pantoprazole (PROTONIX) 40 MG tablet Take 40 mg by mouth daily.   tizanidine (ZANAFLEX) 2 MG capsule Take 2 mg by mouth daily.   traZODone (DESYREL) 100 MG tablet Take 100 mg by mouth at bedtime.   vitamin E 180 MG (400 UNITS) capsule Take 400 Units by mouth daily. 450 mg (1,000 unit) capsule          Past Medical History:  Diagnosis Date   Asthma    COPD (chronic obstructive pulmonary disease) (HCC)    Diabetes mellitus without complication (HCC)    GERD (gastroesophageal reflux disease)    Hyperlipidemia    Hypertension    Hypothyroidism    OSA (obstructive sleep apnea)         Objective:     Wt Readings from Last 3 Encounters:  01/20/21 177 lb 6.4 oz (80.5 kg)  01/14/21 177 lb 12.8 oz (80.6 kg)  01/06/21 178 lb (80.7 kg)      Vital signs reviewed  01/20/2021  - Note at rest 02 sats  96% on RA   General appearance:    pleasant elderly amb wf with somewhat of a halting speech pattern and mild pseudowheeze   HEENT : pt wearing mask not removed for exam due to covid -19 concerns.    NECK :  without JVD/Nodes/TM/ nl carotid upstrokes bilaterally   LUNGS: no acc muscle use,  Nl contour chest which is clear to A and P bilaterally without cough on insp or exp maneuvers   CV:  RRR  no s3 or murmur or increase in P2, and no edema   ABD:  soft and nontender with nl inspiratory excursion in the supine position. No bruits or organomegaly appreciated, bowel sounds nl  MS:  Nl gait/ ext warm without deformities, calf tenderness, cyanosis or clubbing No obvious joint restrictions   SKIN: warm and dry without lesions    NEURO:  alert, approp, nl sensorium with  no motor or cerebellar deficits apparent.        I personally reviewed images and agree with radiology impression as follows:   Chest CT cuts on ct cardiac study 12/2020 Within the visualized portions of the thorax there are no  suspicious appearing pulmonary nodules or masses, there is no acute consolidative airspace disease, no pleural effusions, no pneumothorax and no lymphadenopathy.        Assessment

## 2021-01-22 ENCOUNTER — Ambulatory Visit: Payer: Medicare HMO | Admitting: Physical Therapy

## 2021-01-29 ENCOUNTER — Other Ambulatory Visit: Payer: Self-pay | Admitting: Pulmonary Disease

## 2021-02-05 ENCOUNTER — Telehealth: Payer: Self-pay | Admitting: *Deleted

## 2021-02-15 ENCOUNTER — Ambulatory Visit: Payer: Medicare HMO | Attending: Neurology | Admitting: Physical Therapy

## 2021-02-15 ENCOUNTER — Other Ambulatory Visit: Payer: Self-pay

## 2021-02-15 DIAGNOSIS — R2681 Unsteadiness on feet: Secondary | ICD-10-CM | POA: Diagnosis present

## 2021-02-15 DIAGNOSIS — R262 Difficulty in walking, not elsewhere classified: Secondary | ICD-10-CM | POA: Diagnosis present

## 2021-02-15 DIAGNOSIS — R42 Dizziness and giddiness: Secondary | ICD-10-CM | POA: Diagnosis not present

## 2021-02-15 NOTE — Therapy (Signed)
[facility] [address] [place], [place], [address] Phone: [phone]   Fax:  [phone]  Physical Therapy Treatment  Patient Details  Name: Kayla Solomon MRN: [id number] Date of Birth: 10/05/1950 Referring Provider (PT): Rema Jasmine, MD   Encounter Date: 02/15/2021   PT End of Session - 02/15/21 2031     Visit Number 2    Number of Visits 9    Date for PT Re-Evaluation 03/01/21    Authorization Type $20 copay, [employer]    Authorization - Visit Number 1    Authorization - Number of Visits 8    Progress Note Due on Visit 10    PT Start Time 1548    PT Stop Time 1635    PT Time Calculation (min) 47 min    Activity Tolerance Patient tolerated treatment well    Behavior During Therapy WFL for tasks assessed/performed             Past Medical History:  Diagnosis Date   Asthma    COPD (chronic obstructive pulmonary disease) (HCC)    Diabetes insipidus (HCC)    Diabetes mellitus without complication (HCC)    GERD (gastroesophageal reflux disease)    Hyperlipidemia    Hypertension    Hypothyroidism    OSA (obstructive sleep apnea)     Past Surgical History:  Procedure Laterality Date   SHOULDER SURGERY     ulnar surgery      There were no vitals filed for this visit.   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  Pt would like a second opinion.  When performing chores at home pt continues to get very dizzy; also still getting dizzy when walking down hill or down stairs but not up.  Denies SOB but reports increased HR.    Patient is accompained by: Family member    Pertinent History COPD, asthma, Diabetes insipidus, HLD, HTN, hypothyroidism, OSA, scoliosis, h/o migraines, OA, osteoporosis, shoulder surgery, ulnar surgery    Diagnostic tests MRI - no abnormalities    Patient Stated Goals To  work on imbalance and not feel dizzy when turning her head; may go back to driving if able                [facility] PT Assessment - 02/15/21 1628       Functional Gait  Assessment   Gait assessed  Yes    Gait Level Surface Walks 20 ft in less than 7 sec but greater than 5.5 sec, uses assistive device, slower speed, mild gait deviations, or deviates 6-10 in outside of the 12 in walkway width.    Change in Gait Speed Able to change speed, demonstrates mild gait deviations, deviates 6-10 in outside of the 12 in walkway width, or no gait deviations, unable to achieve a major change in velocity, or uses a change in velocity, or uses an assistive device.   dizziness   Gait with Horizontal Head Turns Performs head turns smoothly with slight change in gait velocity (eg, minor disruption to smooth gait path), deviates 6-10 in outside 12 in walkway width, or uses an assistive device.   mild dizziness   Gait with Vertical Head Turns Performs task with slight change in gait velocity (eg, minor disruption to smooth gait path), deviates 6 - 10 in outside 12 in walkway width or uses assistive device  slight dizziness   Gait and Pivot Turn Pivot turns safely in greater than 3 sec and stops with no loss of balance, or pivot turns safely within 3 sec and stops with mild imbalance, requires small steps to catch balance.    Step Over Obstacle Is able to step over 2 stacked shoe boxes taped together (9 in total height) without changing gait speed. No evidence of imbalance.    Gait with Narrow Base of Support Ambulates less than 4 steps heel to toe or cannot perform without assistance.    Gait with Eyes Closed Walks 20 ft, no assistive devices, good speed, no evidence of imbalance, normal gait pattern, deviates no more than 6 in outside 12 in walkway width. Ambulates 20 ft in less than 7 sec.   but reports significant dizziness   Ambulating Backwards Cannot walk 20 ft without assistance, severe gait deviations or  imbalance, deviates greater than 15 in outside 12 in walkway width or will not attempt task.    Steps Alternating feet, must use rail.   dizziness when descending   Total Score 18    FGA comment: 18/30                 Vestibular Assessment - 02/15/21 1604       Symptom Behavior   Type of Dizziness  Imbalance      Positional Sensitivities   Sit to Supine Mild dizziness    Supine to Left Side Mild dizziness   feels more unstable on L side   Supine to Right Side Mild dizziness    Supine to Sitting Moderate dizziness    Nose to Right Knee Mild dizziness    Right Knee to Sitting Mild dizziness    Nose to Left Knee Moderate dizziness    Left Knee to Sitting Moderate dizziness    Head Turning x 5 Moderate dizziness    Head Nodding x 5 Moderate dizziness    Pivot Right in Standing Moderate dizziness    Pivot Left in Standing Moderate dizziness    Rolling Right Mild dizziness    Rolling Left Mild dizziness                               PT Education - 02/15/21 2026     Education Details Continued to educate pt on motion sensitivity and central pathways involved in vestibular processing to further clarify ENT findings.  Provided pt with information for [facility] Dizz[facility]; pt to discuss with PCP and will seek referral from PCP for second opinion.  Findings from MSQ and FGA assessment.  Discussed plan for therapy - if pt wanted to proceed with visits or wait until after second opinion - pt agreeable to continue with therapy visits - discussed plan for next visit.    Person(s) Educated Patient    Methods Explanation    Comprehension Verbalized understanding              PT Short Term Goals - 02/15/21 2037       PT SHORT TERM GOAL #1   Title Pt will participate in further vestibular assessment with MSQ and DVA.    Time 4    Period Weeks    Status New    Target Date 01/30/21      PT SHORT TERM GOAL #2   Title Pt will initiate and demonstrate  compliance with initial vestibular HEP    Time 4  Period Weeks    Status New    Target Date 01/30/21      PT SHORT TERM GOAL #3   Title Pt will participate in further falls risk assessment with FGA    Baseline 18/30    Time 4    Period Weeks    Status Achieved    Target Date 01/30/21               PT Long Term Goals - 02/15/21 2037       PT LONG TERM GOAL #1   Title Pt will demonstrate independence with final HEP  (ALL LTG DUE 03/01/21)    Time 8    Period Weeks    Status New      PT LONG TERM GOAL #2   Title Pt will demonstrate improvement in FOTO DPS to 58% and DFS to >/= 60%    Baseline 45 and 55    Time 8    Period Weeks    Status New      PT LONG TERM GOAL #3   Title Pt will report 0/5 dizziness on all items on MSQ to improve safety when performing household activities and to be able to return to driving    Baseline 2-3 for all movements    Time 8    Period Weeks    Status New      PT LONG TERM GOAL #4   Title Pt will demonstrate improved VOR gain as indicated by 2-3 line difference on DVA    Baseline TBD    Time 8    Period Weeks    Status New      PT LONG TERM GOAL #5   Title Pt will demonstrate decreased falls risk as indicated by increase in FGA by 4 points    Baseline 18/30    Time 8    Period Weeks    Status New                   Plan - 02/15/21 2032     Clinical Impression Statement Increased time today spent discussing patient's recent ENT visit, findings, pt's desire for second opinion and purpose/benefits of vestibular rehab.  Pt agreeable to continue with therapy visits while she seeks out second opinion.  Performed assessment of motion sensitivity and balance during dynamic gait.  Pt reporting mild-moderate motion sensitivity overall and does demonstrate increased falls risk with higher level gait challenges.  Will utilize results and initiate HEP next session.    Personal Factors and Comorbidities Comorbidity  3+;Fitness;Past/Current Experience;Time since onset of injury/illness/exacerbation;Transportation    Comorbidities COPD, Diabetes insipidus, syncope/falls, HLD, HTN, hypothyroidism, OSA, scoliosis, h/o migraines, OA, osteoporosis, shoulder surgery, ulnar surgery    Examination-Activity Limitations Bend;Locomotion Level;Bathing    Examination-Participation Restrictions Cleaning;Driving;Meal Prep    Stability/Clinical Decision Making Evolving/Moderate complexity    Rehab Potential Good    PT Frequency 1x / week    PT Duration 8 weeks    PT Treatment/Interventions ADLs/Self Care Home Management;Canalith Repostioning;Gait training;Stair training;Functional mobility training;Therapeutic activities;Therapeutic exercise;Balance training;Neuromuscular re-education;Patient/family education;Vestibular;Cryotherapy;Moist Heat;Manual techniques;Dry needling    PT Next Visit Plan Check DVA.  Start HEP: VOR, habituation, corner balance - progress exercises slowly, pt very motion sensitive    Consulted and Agree with Plan of Care Patient             Patient will benefit from skilled therapeutic intervention in order to improve the following deficits and impairments:  Decreased activity tolerance, Decreased balance,  Difficulty walking, Dizziness, Pain  Visit Diagnosis: Dizziness and giddiness  Unsteadiness on feet  Difficulty in walking, not elsewhere classified     Problem List Patient Active Problem List   Diagnosis Date Noted   OSA on CPAP 01/14/2021   DOE (dyspnea on exertion) 11/27/2020    Dierdre Highman, PT, DPT 02/15/21    8:38 PM    [facility] [address] [place], [place], [address] Phone: [phone]   Fax:  [phone]  Name: Kayla Solomon MRN: [id number] Date of Birth: 1950/07/01

## 2021-02-23 ENCOUNTER — Other Ambulatory Visit: Payer: Self-pay

## 2021-02-23 ENCOUNTER — Ambulatory Visit: Payer: Medicare HMO | Attending: Neurology

## 2021-02-23 DIAGNOSIS — R2681 Unsteadiness on feet: Secondary | ICD-10-CM | POA: Insufficient documentation

## 2021-02-23 DIAGNOSIS — R262 Difficulty in walking, not elsewhere classified: Secondary | ICD-10-CM | POA: Diagnosis present

## 2021-02-23 DIAGNOSIS — R42 Dizziness and giddiness: Secondary | ICD-10-CM | POA: Diagnosis present

## 2021-02-23 NOTE — Therapy (Signed)
[facility] [address] [place], [place], [address] Phone: [phone]   Fax:  [phone]  Physical Therapy Treatment  Patient Details  Name: Kayla Solomon MRN: [id number] Date of Birth: 06-29-1950 Referring Provider (PT): Rema Jasmine, MD   Encounter Date: 02/23/2021   PT End of Session - 02/23/21 1149     Visit Number 3    Number of Visits 9    Date for PT Re-Evaluation 03/01/21    Authorization Type $20 copay, [employer]    Authorization - Visit Number 2    Authorization - Number of Visits 8    Progress Note Due on Visit 10    PT Start Time 1149    PT Stop Time 1228    PT Time Calculation (min) 39 min    Activity Tolerance Patient tolerated treatment well    Behavior During Therapy WFL for tasks assessed/performed             Past Medical History:  Diagnosis Date   Asthma    COPD (chronic obstructive pulmonary disease) (HCC)    Diabetes insipidus (HCC)    Diabetes mellitus without complication (HCC)    GERD (gastroesophageal reflux disease)    Hyperlipidemia    Hypertension    Hypothyroidism    OSA (obstructive sleep apnea)     Past Surgical History:  Procedure Laterality Date   SHOULDER SURGERY     ulnar surgery      There were no vitals filed for this visit.   Subjective Assessment - 02/23/21 1151     Subjective Patient reports continues to feel dizziness with head movement. Still reports HA daily, but is mild. No new changes. No falls.    Patient is accompained by: Family member    Pertinent History COPD, asthma, Diabetes insipidus, HLD, HTN, hypothyroidism, OSA, scoliosis, h/o migraines, OA, osteoporosis, shoulder surgery, ulnar surgery    Diagnostic tests MRI - no abnormalities    Patient Stated Goals To work on imbalance and not feel dizzy when turning her head; may go back to driving if able    Currently in Pain? No/denies                Vestibular Assessment  - 02/23/21 0001       Visual Acuity   Static 11    Dynamic 9   reports dizziness, 6-7/10 with completion required seated rest break for resolution             Vestibular Treatment/Exercise - 02/23/21 0001       Vestibular Treatment/Exercise   Vestibular Treatment Provided Gaze    Habituation Exercises Standing Horizontal Head Turns;Standing Vertical Head Turns    Gaze Exercises X1 Viewing Horizontal;X1 Viewing Vertical      Standing Horizontal Head Turns   Number of Reps  5    Symptom Description  x 2 sets, moderate dizziness. able to return to baseline with seated rest break. PT educated on slowed speed of movement, but focus on improved range      Standing Vertical Head Turns   Number of Reps  4    Symptom Description  x 1, unable to complete full set due to dizziness.      X1 Viewing Horizontal   Foot Position seated    Reps 2    Comments x 25 seconds, moderate dizziness      X1 Viewing Vertical   Foot Position seated    Reps 2    Comments  unable to tolerate 5 seconds, reports she feels as if she will fall. very SLOW VOR.                Balance Exercises - 02/23/21 0001       Balance Exercises: Standing   Standing Eyes Closed Narrow base of support (BOS);Solid surface;3 reps;30 secs;Limitations    Standing Eyes Closed Limitations mild dizziness intermittently            Gaze Stabilization: Sitting    Keeping eyes on target on wall 3-4 feet away, tilt head down 15-30 and move head side to side for 20-25 seconds. Do 2-3 sessions per day.   Gaze Stabilization: Tip Card  1.Target must remain in focus, not blurry, and appear stationary while head is in motion. 2.Perform exercises with small head movements (45 to either side of midline). 3.Increase speed of head motion so long as target is in focus. 4.If you wear eyeglasses, be sure you can see target through lens (therapist will give specific instructions for bifocal / progressive  lenses). 5.These exercises may provoke dizziness or nausea. Work through these symptoms. If too dizzy, slow head movement slightly. Rest between each exercise. 6.Exercises demand concentration; avoid distractions. 7.For safety, perform standing exercises close to a counter, wall, corner, or next to someone.  Copyright  VHI. All rights reserved.   Feet Together- Eyes Closed    Stand with feet together and arms out. Close eyes and visualize upright position. Hold 30  seconds. Repeat 2-3 times per session. Do 1 sessions per day.  Copyright  VHI. All rights reserved.    Feet Apart, Head Motion - Eyes Open    With eyes open, feet apart, move head slowly: side to side. Repeat 5 times per session. Do 2 sessions per day.  Copyright  VHI. All rights reserved.      PT Education - 02/23/21 1227     Education Details Initial HEP for Habituation/Balance    Person(s) Educated Patient    Methods Explanation;Demonstration;Handout    Comprehension Verbalized understanding;Returned demonstration              PT Short Term Goals - 02/15/21 2037       PT SHORT TERM GOAL #1   Title Pt will participate in further vestibular assessment with MSQ and DVA.    Time 4    Period Weeks    Status New    Target Date 01/30/21      PT SHORT TERM GOAL #2   Title Pt will initiate and demonstrate compliance with initial vestibular HEP    Time 4    Period Weeks    Status New    Target Date 01/30/21      PT SHORT TERM GOAL #3   Title Pt will participate in further falls risk assessment with FGA    Baseline 18/30    Time 4    Period Weeks    Status Achieved    Target Date 01/30/21               PT Long Term Goals - 02/15/21 2037       PT LONG TERM GOAL #1   Title Pt will demonstrate independence with final HEP  (ALL LTG DUE 03/01/21)    Time 8    Period Weeks    Status New      PT LONG TERM GOAL #2   Title Pt will demonstrate improvement in FOTO DPS to 58% and DFS to >/=  60%    Baseline 45 and 55    Time 8    Period Weeks    Status New      PT LONG TERM GOAL #3   Title Pt will report 0/5 dizziness on all items on MSQ to improve safety when performing household activities and to be able to return to driving    Baseline 2-3 for all movements    Time 8    Period Weeks    Status New      PT LONG TERM GOAL #4   Title Pt will demonstrate improved VOR gain as indicated by 2-3 line difference on DVA    Baseline TBD    Time 8    Period Weeks    Status New      PT LONG TERM GOAL #5   Title Pt will demonstrate decreased falls risk as indicated by increase in FGA by 4 points    Baseline 18/30    Time 8    Period Weeks    Status New                   Plan - 02/23/21 1231     Clinical Impression Statement Today's session focused on DVA assesment, patient demonstrating 2 line difference but significant dizziness with completion. Rest of session spent establishing initial vestibular/balance HEP. Patient only able to tolerate approx 20-25 seconds of horizontal VOR, unable to tolerate vertical today therefore witheld. Patient overall more sensitive to vertical > horizontal head movements, and very guarded in the cervical region. Will continue to progress toward all LTGs.    Personal Factors and Comorbidities Comorbidity 3+;Fitness;Past/Current Experience;Time since onset of injury/illness/exacerbation;Transportation    Comorbidities COPD, Diabetes insipidus, syncope/falls, HLD, HTN, hypothyroidism, OSA, scoliosis, h/o migraines, OA, osteoporosis, shoulder surgery, ulnar surgery    Examination-Activity Limitations Bend;Locomotion Level;Bathing    Examination-Participation Restrictions Cleaning;Driving;Meal Prep    Stability/Clinical Decision Making Evolving/Moderate complexity    Rehab Potential Good    PT Frequency 1x / week    PT Duration 8 weeks    PT Treatment/Interventions ADLs/Self Care Home Management;Canalith Repostioning;Gait training;Stair  training;Functional mobility training;Therapeutic activities;Therapeutic exercise;Balance training;Neuromuscular re-education;Patient/family education;Vestibular;Cryotherapy;Moist Heat;Manual techniques;Dry needling    PT Next Visit Plan review HEP. continue VOR, habituation, corner balance - progress exercises slowly, pt very motion sensitive    Consulted and Agree with Plan of Care Patient             Patient will benefit from skilled therapeutic intervention in order to improve the following deficits and impairments:  Decreased activity tolerance, Decreased balance, Difficulty walking, Dizziness, Pain  Visit Diagnosis: Dizziness and giddiness  Unsteadiness on feet  Difficulty in walking, not elsewhere classified     Problem List Patient Active Problem List   Diagnosis Date Noted   OSA on CPAP 01/14/2021   DOE (dyspnea on exertion) 11/27/2020    Tempie Donning, PT, DPT 02/23/2021, 12:41 PM  [facility] [address] [place], [place], [address] Phone: [phone]   Fax:  [phone]  Name: Cathrine Krizan MRN: [id number] Date of Birth: 02-20-1951

## 2021-02-23 NOTE — Patient Instructions (Addendum)
Gaze Stabilization: Sitting    Keeping eyes on target on wall 3-4 feet away, tilt head down 15-30 and move head side to side for 20-25 seconds. Do 2-3 sessions per day.   Gaze Stabilization: Tip Card  1.Target must remain in focus, not blurry, and appear stationary while head is in motion. 2.Perform exercises with small head movements (45 to either side of midline). 3.Increase speed of head motion so long as target is in focus. 4.If you wear eyeglasses, be sure you can see target through lens (therapist will give specific instructions for bifocal / progressive lenses). 5.These exercises may provoke dizziness or nausea. Work through these symptoms. If too dizzy, slow head movement slightly. Rest between each exercise. 6.Exercises demand concentration; avoid distractions. 7.For safety, perform standing exercises close to a counter, wall, corner, or next to someone.  Copyright  VHI. All rights reserved.   Feet Together- Eyes Closed    Stand with feet together and arms out. Close eyes and visualize upright position. Hold 30  seconds. Repeat 2-3 times per session. Do 1 sessions per day.  Copyright  VHI. All rights reserved.    Feet Apart, Head Motion - Eyes Open    With eyes open, feet apart, move head slowly: side to side. Repeat 5 times per session. Do 2 sessions per day.  Copyright  VHI. All rights reserved.

## 2021-03-02 ENCOUNTER — Ambulatory Visit: Payer: Medicare HMO

## 2021-03-11 ENCOUNTER — Ambulatory Visit: Payer: Medicare HMO | Admitting: Physical Therapy

## 2021-03-15 ENCOUNTER — Ambulatory Visit: Payer: Medicare HMO

## 2021-03-15 ENCOUNTER — Other Ambulatory Visit: Payer: Self-pay

## 2021-03-15 DIAGNOSIS — R42 Dizziness and giddiness: Secondary | ICD-10-CM | POA: Diagnosis not present

## 2021-03-15 DIAGNOSIS — R2681 Unsteadiness on feet: Secondary | ICD-10-CM

## 2021-03-15 DIAGNOSIS — R262 Difficulty in walking, not elsewhere classified: Secondary | ICD-10-CM

## 2021-03-15 NOTE — Therapy (Signed)
[facility] [address] [place], [place], [address] Phone: [phone]   Fax:  [phone]  Physical Therapy Treatment/Discharge Summary  Patient Details  Name: Kayla Solomon MRN: [id number] Date of Birth: Aug 16, 1950 Referring Provider (PT): Melburn Popper, MD  PHYSICAL THERAPY DISCHARGE SUMMARY  Visits from Start of Care: 4  Current functional level related to goals / functional outcomes: See Clinical Impression Statement   Remaining deficits: Dizziness; Imbalance; Pain   Education / Equipment: HEP provided   Patient agrees to discharge. Patient goals were not met. Patient is being discharged due to lack of progress.   Encounter Date: 03/15/2021   PT End of Session - 03/15/21 1235     Visit Number 4    Number of Visits 9    Date for PT Re-Evaluation 03/16/21    Authorization Type $20 copay, [employer]    Authorization - Visit Number 3    Authorization - Number of Visits 8    Progress Note Due on Visit 10    PT Start Time 1236    PT Stop Time 1315    PT Time Calculation (min) 39 min    Activity Tolerance Patient tolerated treatment well    Behavior During Therapy WFL for tasks assessed/performed             Past Medical History:  Diagnosis Date   Asthma    COPD (chronic obstructive pulmonary disease) ([facility])    Diabetes insipidus ([facility])    Diabetes mellitus without complication ([facility])    GERD (gastroesophageal reflux disease)    Hyperlipidemia    Hypertension    Hypothyroidism    OSA (obstructive sleep apnea)     Past Surgical History:  Procedure Laterality Date   SHOULDER SURGERY     ulnar surgery      There were no vitals filed for this visit.   Subjective Assessment - 03/15/21 1238     Subjective Patient reports that the arthritis has been flared up more recently. Patient reports the dizziness has been about the same. Reports that she felt like the dizziness was worse due  to the arthritis. No falls. Reports she is trying the complete the HEP approx 3x/week,but reports it is painful to stand with balance exercises.    Patient is accompained by: Family member    Pertinent History COPD, asthma, Diabetes insipidus, HLD, HTN, hypothyroidism, OSA, scoliosis, h/o migraines, OA, osteoporosis, shoulder surgery, ulnar surgery    Diagnostic tests MRI - no abnormalities    Patient Stated Goals To work on imbalance and not feel dizzy when turning her head; may go back to driving if able    Currently in Pain? Yes    Pain Score 3     Pain Location Leg    Pain Orientation Right;Left    Pain Descriptors / Indicators Aching;Sharp    Pain Type Chronic pain    Pain Radiating Towards towards the feet    Pain Onset More than a month ago    Pain Frequency Intermittent                [facility] PT Assessment - 03/15/21 0001       Assessment   Medical Diagnosis Dizziness    Referring Provider (PT) Melburn Popper, MD      Functional Gait  Assessment   Gait assessed  Yes    Gait Level Surface Walks 20 ft in less than 7 sec but greater than 5.5 sec, uses assistive  device, slower speed, mild gait deviations, or deviates 6-10 in outside of the 12 in walkway width.   6.7 seconds   Change in Gait Speed Able to change speed, demonstrates mild gait deviations, deviates 6-10 in outside of the 12 in walkway width, or no gait deviations, unable to achieve a major change in velocity, or uses a change in velocity, or uses an assistive device.    Gait with Horizontal Head Turns Performs head turns smoothly with slight change in gait velocity (eg, minor disruption to smooth gait path), deviates 6-10 in outside 12 in walkway width, or uses an assistive device.    Gait with Vertical Head Turns Performs task with slight change in gait velocity (eg, minor disruption to smooth gait path), deviates 6 - 10 in outside 12 in walkway width or uses assistive device    Gait and Pivot Turn Pivot  turns safely in greater than 3 sec and stops with no loss of balance, or pivot turns safely within 3 sec and stops with mild imbalance, requires small steps to catch balance.    Step Over Obstacle Is able to step over one shoe box (4.5 in total height) without changing gait speed. No evidence of imbalance.    Gait with Narrow Base of Support Ambulates less than 4 steps heel to toe or cannot perform without assistance.    Gait with Eyes Closed Walks 20 ft, no assistive devices, good speed, no evidence of imbalance, normal gait pattern, deviates no more than 6 in outside 12 in walkway width. Ambulates 20 ft in less than 7 sec.    Ambulating Backwards Walks 20 ft, slow speed, abnormal gait pattern, evidence for imbalance, deviates 10-15 in outside 12 in walkway width.    Steps Alternating feet, must use rail.    Total Score 18    FGA comment: 18/30. moderate dizziness with completion. CGA/intermittent standing rest break                 Vestibular Assessment - 03/15/21 0001       Visual Acuity   Static 11    Dynamic 10   moderate dizziness     Positional Sensitivities   Sit to Supine Mild dizziness    Supine to Left Side Mild dizziness    Supine to Right Side Lightheadedness    Supine to Sitting Mild dizziness    Right Hallpike Mild dizziness    Up from Right Hallpike Lightheadedness    Up from Left Hallpike Lightheadedness    Nose to Right Knee Mild dizziness    Right Knee to Sitting Lightedness    Nose to Left Knee Mild dizziness    Left Knee to Sitting Lightheadedness    Head Turning x 5 Moderate dizziness    Head Nodding x 5 Moderate dizziness    Pivot Right in Standing Mild dizziness    Pivot Left in Standing Mild dizziness    Rolling Right No dizziness    Rolling Left Mild dizziness              PT Education - 03/15/21 1316     Education Details progress toward LTGs; information on [employer]) Educated Patient    Methods Explanation     Comprehension Verbalized understanding              PT Short Term Goals - 02/15/21 2037       PT SHORT TERM GOAL #1   Title Pt will participate in  further vestibular assessment with MSQ and DVA.    Time 4    Period Weeks    Status New    Target Date 01/30/21      PT SHORT TERM GOAL #2   Title Pt will initiate and demonstrate compliance with initial vestibular HEP    Time 4    Period Weeks    Status New    Target Date 01/30/21      PT SHORT TERM GOAL #3   Title Pt will participate in further falls risk assessment with FGA    Baseline 18/30    Time 4    Period Weeks    Status Achieved    Target Date 01/30/21               PT Long Term Goals - 03/15/21 1242       PT LONG TERM GOAL #1   Title Pt will demonstrate independence with final HEP  (ALL LTG DUE 03/01/21)    Baseline reports cocmpleted 3x/week, not completing consistently    Time 8    Period Weeks    Status Not Met      PT LONG TERM GOAL #2   Title Pt will demonstrate improvement in FOTO DPS to 58% and DFS to >/= 60%    Baseline 45 and 55; did not capture on d/c    Time 8    Period Weeks    Status Deferred      PT LONG TERM GOAL #3   Title Pt will report 0/5 dizziness on all items on MSQ to improve safety when performing household activities and to be able to return to driving    Baseline 2-3 for all movements; continue to have 2-3/5 dizziness on MSQ (see flowsheet)    Time 8    Period Weeks    Status Not Met      PT LONG TERM GOAL #4   Title Pt will demonstrate improved VOR gain as indicated by 2-3 line difference on DVA    Baseline 1 line difference    Time 8    Period Weeks    Status Achieved      PT LONG TERM GOAL #5   Title Pt will demonstrate decreased falls risk as indicated by increase in FGA by 4 points    Baseline 18/30; 18/30 on 11/21    Time 8    Period Weeks    Status Not Met                   Plan - 03/15/21 1505     Clinical Impression Statement Completed  assesment of patient's progress toward all LTGs. Patient only able to meet LTG #4 demonstrating 1 line difference on DVA. Patient has not showed any progress with balance or dizziness symptoms on FGA and MSQ. Patient unfortunately has been inconsistent with attendance to PT services due to other medical concerns, and reports non compliance with HEP at times due to pain limitations. At this time, patient will be d/c from PT services due to lack of progress and obtain second opinion from ENT[facility]clinic at [facility]. Patient agreeable to d/c today.    Personal Factors and Comorbidities Comorbidity 3+;Fitness;Past/Current Experience;Time since onset of injury/illness/exacerbation;Transportation    Comorbidities COPD, Diabetes insipidus, syncope/falls, HLD, HTN, hypothyroidism, OSA, scoliosis, h/o migraines, OA, osteoporosis, shoulder surgery, ulnar surgery    Examination-Activity Limitations Bend;Locomotion Level;Bathing    Examination-Participation Restrictions Cleaning;Driving;Meal Prep    Stability/Clinical Decision Making Evolving/Moderate complexity  Rehab Potential Good    PT Frequency 1x / week    PT Duration 8 weeks    PT Treatment/Interventions ADLs/Self Care Home Management;Canalith Repostioning;Gait training;Stair training;Functional mobility training;Therapeutic activities;Therapeutic exercise;Balance training;Neuromuscular re-education;Patient/family education;Vestibular;Cryotherapy;Moist Heat;Manual techniques;Dry needling    PT Next Visit Plan patient d/c from PT services    Consulted and Agree with Plan of Care Patient             Patient will benefit from skilled therapeutic intervention in order to improve the following deficits and impairments:  Decreased activity tolerance, Decreased balance, Difficulty walking, Dizziness, Pain  Visit Diagnosis: Dizziness and giddiness  Unsteadiness on feet  Difficulty in walking, not elsewhere classified     Problem List Patient  Active Problem List   Diagnosis Date Noted   OSA on CPAP 01/14/2021   DOE (dyspnea on exertion) 11/27/2020    Jones Bales, PT, DPT 03/15/2021, 3:09 PM  [facility] [address] [place], [place], [address] Phone: [phone]   Fax:  [phone]  Name: Kayla Solomon MRN: [id number] Date of Birth: December 25, 1950

## 2021-03-22 ENCOUNTER — Ambulatory Visit: Payer: Medicare HMO

## 2021-04-05 ENCOUNTER — Encounter: Payer: Medicare HMO | Admitting: Physical Therapy

## 2021-04-08 ENCOUNTER — Ambulatory Visit: Payer: Medicare HMO | Admitting: Pharmacist Clinician (PhC)/ Clinical Pharmacy Specialist

## 2021-04-08 ENCOUNTER — Other Ambulatory Visit: Payer: Self-pay

## 2021-04-08 DIAGNOSIS — I10 Essential (primary) hypertension: Secondary | ICD-10-CM | POA: Diagnosis not present

## 2021-04-08 MED ORDER — NITROGLYCERIN 0.4 MG SL SUBL: 0.4000 mg | SUBLINGUAL_TABLET | SUBLINGUAL | 12 refills | Status: DC | PRN

## 2021-04-08 NOTE — Patient Instructions (Signed)
It was nice to meet you today!  Check your blood pressure at home 2-3 days per week and keep record of the readings.  Consider an [employer] blood pressure cuff - even their least expensive cuff is as good as the more expensive.  Take your BP meds as follows:  Continue with your current medications  Bring all of your meds, your BP cuff and your record of home blood pressures to your next appointment.  Exercise as youre able, try to walk approximately 30 minutes per day.  Keep salt intake to a minimum, especially watch canned and prepared boxed foods.  Eat more fresh fruits and vegetables and fewer canned items.  Avoid eating in fast food restaurants.    HOW TO TAKE YOUR BLOOD PRESSURE: Rest 5 minutes before taking your blood pressure.  Dont smoke or drink caffeinated beverages for at least 30 minutes before. Take your blood pressure before (not after) you eat. Sit comfortably with your back supported and both feet on the floor (dont cross your legs). Elevate your arm to heart level on a table or a desk. Use the proper sized cuff. It should fit smoothly and snugly around your bare upper arm. There should be enough room to slip a fingertip under the cuff. The bottom edge of the cuff should be 1 inch above the crease of the elbow. Ideally, take 3 measurements at one sitting and record the average.

## 2021-04-08 NOTE — Progress Notes (Deleted)
04/08/2021 [name] 09-20-1950 [id number]   HPI:  Kayla Solomon is a 70 y.o. female patient of Dr Flora Lipps, with a PMH below who presents today for hypertension clinic evaluation. She was seen in September, after having relocated from [place], [place].    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  GERD      Blood Pressure Goal:  130/80  Current Medications: amlodipine 5 mg qd,   Family Hx: father with htn, died at at pacemaker insertion, was 35, had atherosclerosis; mother died due to malpractice with bronchoscopy; 1 sister severely disabled, has had stent, multiple brain surgeries, mitral valve prolapse, brain bleed  Social Hx: no, only occasional alcohol; stopped coffee mostly  Diet: mostly home cooked meals, avoids processed foods, chemicals and preservatives; vegetables are mainly fresh; gefeltefish, avoids salt, avoids carbohydrates mostly   Exercise: none, has some chronic dizziness with head movements (can't drive)  Home BP readings:   Intolerances: nkda  Labs: WNL   Wt Readings from Last 3 Encounters:  01/20/21 177 lb 6.4 oz (80.5 kg)  01/14/21 177 lb 12.8 oz (80.6 kg)  01/06/21 178 lb (80.7 kg)   BP Readings from Last 3 Encounters:  01/20/21 120/68  01/14/21 138/76  12/29/20 122/76   Pulse Readings from Last 3 Encounters:  01/20/21 81  01/14/21 76  12/29/20 (!) 52    Current Outpatient Medications  Medication Sig Dispense Refill   ALPRAZolam (XANAX) 0.5 MG tablet Take 0.5 mg by mouth at bedtime as needed for anxiety.     amLODipine (NORVASC) 5 MG tablet Take  5 mg by mouth daily.     Ascorbic Acid (VITAMIN C) 1000 MG tablet Take 1,000 mg by mouth daily.     aspirin 81 MG EC tablet      atorvastatin (LIPITOR) 20 MG tablet Take 20 mg by mouth daily.     Biotin 17915 MCG TABS Take 1 capsule by mouth daily.     Calcium 600-200 MG-UNIT tablet Take 2 tablets by mouth daily. Minerals 600 mg Calcium 200 unit tablet     Cholecalciferol (VITAMIN D3) 1.25 MG (50000 UT) CAPS Take 2,000 Units by mouth daily. 2 tablets daily     Cobalamin Combinations (B-12) [phone] MCG SUBL Take 5,000 mcg by mouth daily. 2 daily     desmopressin (DDAVP) 0.1 MG tablet Take 0.1 mg by mouth 2 (two) times daily.     ferrous sulfate 325 (65 FE) MG tablet Take 325 mg by mouth daily with breakfast.     FLUoxetine (PROZAC) 40 MG capsule Take 40 mg by mouth daily.     levothyroxine (SYNTHROID) 112 MCG tablet Take 112 mcg by mouth daily before breakfast.     montelukast (SINGULAIR) 10 MG tablet Take 10 mg by mouth at bedtime.     nitroGLYCERIN (NITROSTAT) 0.4 MG SL tablet Place 0.4 mg under the tongue every 5 (five) minutes as needed for chest pain.     pantoprazole (PROTONIX) 40 MG tablet Take 40 mg by mouth daily.     tizanidine (  ZANAFLEX) 2 MG capsule Take 2 mg by mouth daily.     traZODone (DESYREL) 100 MG tablet Take 100 mg by mouth at bedtime.     vitamin E 180 MG (400 UNITS) capsule Take 400 Units by mouth daily. 450 mg (1,000 unit) capsule     No current facility-administered medications for this visit.    No Known Allergies  Past Medical History:  Diagnosis Date   Asthma    COPD (chronic obstructive pulmonary disease) ([facility])    Diabetes insipidus ([facility])    Diabetes mellitus without complication ([facility])    GERD (gastroesophageal reflux disease)    Hyperlipidemia    Hypertension    Hypothyroidism    OSA (obstructive sleep apnea)     There were no vitals taken for this visit.  No problem-specific Assessment & Plan notes found for this encounter.   Phillips Hay  PharmD CPP [facility] [address] [place], [place] [address] [phone]

## 2021-04-12 ENCOUNTER — Encounter: Payer: Self-pay | Admitting: Pharmacist Clinician (PhC)/ Clinical Pharmacy Specialist

## 2021-04-12 ENCOUNTER — Encounter: Payer: Medicare HMO | Admitting: Physical Therapy

## 2021-04-12 DIAGNOSIS — I10 Essential (primary) hypertension: Secondary | ICD-10-CM | POA: Insufficient documentation

## 2021-04-12 NOTE — Progress Notes (Signed)
04/12/2021 Kayla Solomon 05/01/1950 [id number]   HPI:  Kayla Solomon is a 70 y.o. female patient of Dr Flora Lipps, with a PMH below who presents today for hypertension clinic evaluation. She was seen in September, after having relocated from [place], [place].  Patient has history of diabetes insipidus, and notes that she spent a week in the hospital (in [facility]) about a year ago, and her BP was "all over the place" - she recalls it was as high as 180 systolic.     Today she is in the office with her husband, for BP check.  She notes her home cuff is at least 72 years old, and has been told that it is not accurate.  States no compliance problems or side effects of her current medications.    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  Finished most recent taper this week.     Blood Pressure Goal:  130/80  Current Medications: amlodipine 5 mg qd,   Family Hx: father with htn, died at at pacemaker insertion, was 40, had atherosclerosis; mother died due to malpractice with bronchoscopy; 1 sister severely disabled, has had stent, multiple brain surgeries, mitral valve prolapse, brain bleed  Social Hx: no tobacco, only occasional alcohol; stopped coffee mostly  Diet: mostly home cooked meals, avoids processed foods, chemicals and preservatives; vegetables are mainly fresh; mix of proteins,  avoids salt and most carbs  Exercise: none, has some chronic dizziness with head movements (can't drive)  Home BP readings: no home readings  Intolerances: nkda  Labs: 8/22: Na 133, K 4.4, Glu 81, BUN 9 SCr 0.77, GFR 77   Wt Readings from Last 3 Encounters:  04/08/21 176 lb 9.6 oz (80.1 kg)  01/20/21 177 lb 6.4 oz (80.5 kg)  01/14/21 177 lb 12.8 oz (80.6 kg)   BP Readings from Last 3 Encounters:  04/08/21 128/60  01/20/21 120/68  01/14/21 138/76   Pulse  Readings from Last 3 Encounters:  04/08/21 63  01/20/21 81  01/14/21 76    Current Outpatient Medications  Medication Sig Dispense Refill   ALPRAZolam (XANAX) 0.5 MG tablet Take 0.5 mg by mouth at bedtime as needed for anxiety.     amLODipine (NORVASC) 5 MG tablet Take 5 mg by mouth daily.     Ascorbic Acid (VITAMIN C) 1000 MG tablet Take 1,000 mg by mouth daily.     atorvastatin (LIPITOR) 20 MG tablet Take 20 mg by mouth daily.     Biotin 08657 MCG TABS Take 1 capsule by mouth daily.     Calcium 600-200 MG-UNIT tablet Take 2 tablets by mouth daily. Minerals 600 mg Calcium 200 unit tablet     Cholecalciferol (VITAMIN D3) 1.25 MG (50000 UT) CAPS Take 2,000 Units by mouth daily. 2 tablets daily     co-enzyme Q-10 30 MG capsule Take 30 mg by mouth 2 (two) times daily.     Cobalamin Combinations (B-12) [phone] MCG SUBL Take 5,000 mcg by mouth daily. 2 daily     desmopressin (DDAVP) 0.1 MG tablet Take 0.1 mg by mouth 2 (two) times daily.     ferrous sulfate 325 (65 FE) MG tablet Take 325 mg by mouth daily with breakfast.     FLUoxetine (PROZAC) 40 MG capsule Take 40 mg by mouth daily.     levothyroxine (SYNTHROID) 112  MCG tablet Take 112 mcg by mouth daily before breakfast.     pantoprazole (PROTONIX) 40 MG tablet Take 40 mg by mouth daily.     tizanidine (ZANAFLEX) 2 MG capsule Take 2 mg by mouth daily.     traZODone (DESYREL) 100 MG tablet Take 100 mg by mouth at bedtime.     vitamin E 180 MG (400 UNITS) capsule Take 400 Units by mouth daily. 450 mg (1,000 unit) capsule     aspirin 81 MG EC tablet  (Patient not taking: Reported on 04/08/2021)     montelukast (SINGULAIR) 10 MG tablet Take 10 mg by mouth at bedtime. (Patient not taking: Reported on 04/08/2021)     nitroGLYCERIN (NITROSTAT) 0.4 MG SL tablet Place 1 tablet (0.4 mg total) under the tongue every 5 (five) minutes as needed for chest pain. 25 tablet 12   No current facility-administered medications for this visit.    No Known  Allergies  Past Medical History:  Diagnosis Date   Asthma    COPD (chronic obstructive pulmonary disease) ([facility])    Diabetes insipidus ([facility])    Diabetes mellitus without complication ([facility])    GERD (gastroesophageal reflux disease)    Hyperlipidemia    Hypertension    Hypothyroidism    OSA (obstructive sleep apnea)     Blood pressure 128/60, pulse 63, resp. rate 14, height 5\' 5"  (1.651 m), weight 176 lb 9.6 oz (80.1 kg), SpO2 97 %.  Hypertension Patient with blood pressure fluctuations in relation to diabetes insipidus.  Currently she is doing well and her office BP readings have been good.  She was encouraged to find a new home BP machine over the next month, looking for sales to get a good price.  No changes to her medications at this time, and she can reach out to our office should she have any further problems.     PharmD CPP [facility] [address] [place], [place] [id number]

## 2021-04-12 NOTE — Assessment & Plan Note (Signed)
Patient with blood pressure fluctuations in relation to diabetes insipidus.  Currently she is doing well and her office BP readings have been good.  She was encouraged to find a new home BP machine over the next month, looking for sales to get a good price.  No changes to her medications at this time, and she can reach out to our office should she have any further problems.

## 2021-04-22 ENCOUNTER — Telehealth: Payer: Self-pay | Admitting: Pulmonary Disease

## 2021-04-22 DIAGNOSIS — G4733 Obstructive sleep apnea (adult) (pediatric): Secondary | ICD-10-CM

## 2021-04-23 NOTE — Telephone Encounter (Signed)
° °  Hi Dr Dorise Bullion and spoke to patient who states she needs a new cpap machine and new supplies. Would you like me to place an order for a new machine and if so what would you like her pressure to be?

## 2021-04-27 NOTE — Telephone Encounter (Signed)
Called and spoke to patient. I have placed the order for a new cpap through [employer] with the pressure 5-15cm . Nothing further needed.

## 2021-07-13 ENCOUNTER — Other Ambulatory Visit: Payer: Self-pay

## 2021-07-13 ENCOUNTER — Emergency Department (HOSPITAL_BASED_OUTPATIENT_CLINIC_OR_DEPARTMENT_OTHER): Payer: Medicare Other | Admitting: Radiology

## 2021-07-13 ENCOUNTER — Emergency Department (HOSPITAL_BASED_OUTPATIENT_CLINIC_OR_DEPARTMENT_OTHER)
Admission: EM | Admit: 2021-07-13 | Discharge: 2021-07-13 | Disposition: A | Discharge status: Home / Self Care | Payer: Medicare Other | Attending: Emergency Medicine | Admitting: Emergency Medicine

## 2021-07-13 DIAGNOSIS — M79671 Pain in right foot: Secondary | ICD-10-CM | POA: Diagnosis present

## 2021-07-13 DIAGNOSIS — Z79899 Other long term (current) drug therapy: Secondary | ICD-10-CM | POA: Insufficient documentation

## 2021-07-13 DIAGNOSIS — M79674 Pain in right toe(s): Secondary | ICD-10-CM

## 2021-07-13 DIAGNOSIS — Z7982 Long term (current) use of aspirin: Secondary | ICD-10-CM | POA: Insufficient documentation

## 2021-07-13 MED ORDER — IBUPROFEN 800 MG PO TABS
800.0000 mg | ORAL_TABLET | Freq: Once | ORAL | Status: AC
  Administered 2021-07-13: 800 mg via ORAL
  Filled 2021-07-13: qty 1

## 2021-07-13 NOTE — ED Provider Notes (Signed)
?[facility] EMERGENCY DEPT ?Provider Note ? ? ?CSN: [id number] ?Arrival date & time: 07/13/21  3437 ? ?  ? ?History ? ?Chief Complaint  ?Patient presents with  ? Toe Pain  ?  Right Foot, second toe  ? ? ?Kayla Solomon is a 71 y.o. female. ? ? ?Toe Pain ? ? ?71 year old female presenting to the emergency department with a chief complaint of right second toe pain.  She states that she dropped a big shampoo bottle on her second toe yesterday.  She has been able to ambulate but endorses worsening pain and swelling.  She is worried that she has a fracture.  She denies any significant complaints at this time beyond toe pain.  She denies any other injuries. ? ?Home Medications ?Prior to Admission medications   ?Medication Sig Start Date End Date Taking? Authorizing Provider  ?ALPRAZolam (XANAX) 0.5 MG tablet Take 0.5 mg by mouth at bedtime as needed for anxiety.    [provider]  ?amLODipine (NORVASC) 5 MG tablet Take 5 mg by mouth daily.    [provider]  ?Ascorbic Acid (VITAMIN C) 1000 MG tablet Take 1,000 mg by mouth daily.    [provider]  ?aspirin 81 MG EC tablet  01/04/20   [provider]  ?atorvastatin (LIPITOR) 20 MG tablet Take 20 mg by mouth daily.    [provider]  ?Biotin 35789 MCG TABS Take 1 capsule by mouth daily.    [provider]  ?Calcium 600-200 MG-UNIT tablet Take 2 tablets by mouth daily. Minerals 600 mg Calcium 200 unit tablet    [provider]  ?Cholecalciferol (VITAMIN D3) 1.25 MG (50000 UT) CAPS Take 2,000 Units by mouth daily. 2 tablets daily    [provider]  ?co-enzyme Q-10 30 MG capsule Take 30 mg by mouth 2 (two) times daily.    [provider]  ?Cobalamin Combinations (B-12) [phone] MCG SUBL Take 5,000 mcg by mouth daily. 2 daily    [provider]  ?desmopressin (DDAVP) 0.1 MG tablet Take 0.1 mg by mouth 2 (two) times daily.    [provider]  ?ferrous sulfate 325 (65  FE) MG tablet Take 325 mg by mouth daily with breakfast.    [provider]  ?FLUoxetine (PROZAC) 40 MG capsule Take 40 mg by mouth daily.    [provider]  ?levothyroxine (SYNTHROID) 112 MCG tablet Take 112 mcg by mouth daily before breakfast.    [provider]  ?montelukast (SINGULAIR) 10 MG tablet Take 10 mg by mouth at bedtime. ?Patient not taking: Reported on 04/08/2021    [provider]  ?nitroGLYCERIN (NITROSTAT) 0.4 MG SL tablet Place 1 tablet (0.4 mg total) under the tongue every 5 (five) minutes as needed for chest pain. 04/08/21   O'NealRonnald Ramp, MD  ?pantoprazole (PROTONIX) 40 MG tablet Take 40 mg by mouth daily.    [provider]  ?tizanidine (ZANAFLEX) 2 MG capsule Take 2 mg by mouth daily.    [provider]  ?traZODone (DESYREL) 100 MG tablet Take 100 mg by mouth at bedtime.    [provider]  ?vitamin E 180 MG (400 UNITS) capsule Take 400 Units by mouth daily. 450 mg (1,000 unit) capsule    [provider]  ?   ? ?Allergies    ?Patient has no known allergies.   ? ?Review of Systems   ?Review of Systems  ?All other systems reviewed and are negative. ? ?Physical Exam ?Updated  Vital Signs ?BP (!) 143/72 (BP Location: Right Arm)   Pulse (!) 54   Temp 97.6 ?F (36.4 ?C)   Resp 16   Ht 5\' 5"  (1.651 m)   Wt 81.2 kg   SpO2 98%   BMI 29.79 kg/m?  ?Physical Exam ?Vitals and nursing note reviewed.  ?Constitutional:   ?   General: She is not in acute distress. ?HENT:  ?   Head: Normocephalic and atraumatic.  ?Eyes:  ?   Conjunctiva/sclera: Conjunctivae normal.  ?   Pupils: Pupils are equal, round, and reactive to light.  ?Cardiovascular:  ?   Rate and Rhythm: Normal rate and regular rhythm.  ?Pulmonary:  ?   Effort: Pulmonary effort is normal. No respiratory distress.  ?Abdominal:  ?   General: There is no distension.  ?   Tenderness: There is no guarding.  ?Musculoskeletal:     ?   General: No deformity or signs of  injury.  ?   Cervical back: Neck supple.  ?   Comments: Right second toe with mild tenderness to palpation, no significant swelling, neurovascularly intact with intact capillary refill and sensation  ?Skin: ?   Findings: No lesion or rash.  ?Neurological:  ?   General: No focal deficit present.  ?   Mental Status: She is alert. Mental status is at baseline.  ? ? ?ED Results / Procedures / Treatments   ?Labs ?(all labs ordered are listed, but only abnormal results are displayed) ?Labs Reviewed - No data to display ? ?EKG ?None ? ?Radiology ?DG Foot Complete Right ? ?Result Date: 07/13/2021 ?CLINICAL DATA:  Right toe pain after injury yesterday. EXAM: RIGHT FOOT COMPLETE - 3+ VIEW COMPARISON:  None. FINDINGS: There is no evidence of fracture or dislocation. Severe degenerative changes seen involving the first tarsometatarsal joint. Soft tissues are unremarkable. 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 No acute abnormality seen. Electronically Signed   By: 07/15/2021 M.D.   On: 07/13/2021 11:24   ? ?Procedures ?Procedures  ? ? ?Medications Ordered in ED ?Medications  ?ibuprofen (ADVIL) tablet 800 mg (800 mg Oral Given 07/13/21 1208)  ? ? ?ED Course/ Medical Decision Making/ A&P ?  ?                        ?Medical Decision Making ?Amount and/or Complexity of Data Reviewed ?Radiology: ordered. ? ?Risk ?Prescription drug management. ? ? ? ?71 year old female presenting to the emergency department with a chief complaint of right second toe pain.  She states that she dropped a big shampoo bottle on her second toe yesterday.  She has been able to ambulate but endorses worsening pain and swelling.  She is worried that she has a fracture.  She denies any significant complaints at this time beyond toe pain.  She denies any other injuries. ? ?On arrival, the patient was stable.  Endorsing toe pain after dropping a shampoo bottle on it.  X-ray imaging was performed which revealed  severe degenerative joint disease involving the first tarsometatarsal joint with no acute fracture noted.  The patient was placed in a postop shoe and advised NSAIDs for pain control.  She was administered 800 mg of ibuprofen in the ED.  Stable for discharge. ? ?Final Clinical Impression(s) / ED Diagnoses ?Final diagnoses:  ?Pain of toe of right foot  ? ? ?Rx / DC Orders ?ED Discharge Orders   ? ? None  ? ?  ? ? ?  ?  Ernie AvenaLawsing, , MD ?07/13/21 2104 ? ?

## 2021-07-13 NOTE — ED Notes (Signed)
EMT-P provided AVS using Teachback Method. Patient verbalizes understanding of Discharge Instructions. Opportunity for Questioning and Answers were provided by EMT-P. Patient Discharged from ED.  ? ?

## 2021-07-13 NOTE — Discharge Instructions (Addendum)
You were evaluated in the Emergency Department and after careful evaluation, we did not find any emergent condition requiring admission or further testing in the hospital. ? ?Your exam/testing today was overall reassuring.  Your x-ray imaging was negative for acute fracture.  Recommend rest, ice, elevation of the extremity, and NSAIDs for pain control.  A postop shoe has been provided for comfort. ? ?Please return to the Emergency Department if you experience any worsening of your condition.  Thank you for allowing [place] to be a part of your care. ? ?

## 2021-07-13 NOTE — ED Triage Notes (Signed)
Patient arrives with complaints of toe pain after dropping a big shampoo bottle on her toe (right foot, second toe) yesterday. Patient states that she was able to ambulate, but the pain worsened over night. She would like to make sure her toe isn't broken. Minimal bruising to site. Rates pain a 7/10.  ?

## 2021-10-12 ENCOUNTER — Ambulatory Visit (HOSPITAL_BASED_OUTPATIENT_CLINIC_OR_DEPARTMENT_OTHER)
Admission: RE | Admit: 2021-10-12 | Discharge: 2021-10-12 | Disposition: A | Discharge status: Home / Self Care | Payer: Medicare Other | Source: Ambulatory Visit | Attending: Registered Nurse | Admitting: Registered Nurse

## 2021-10-12 ENCOUNTER — Encounter (HOSPITAL_BASED_OUTPATIENT_CLINIC_OR_DEPARTMENT_OTHER): Payer: Self-pay | Admitting: Radiology

## 2021-10-12 ENCOUNTER — Other Ambulatory Visit (HOSPITAL_BASED_OUTPATIENT_CLINIC_OR_DEPARTMENT_OTHER): Payer: Self-pay | Admitting: Registered Nurse

## 2021-10-12 DIAGNOSIS — R053 Chronic cough: Secondary | ICD-10-CM | POA: Diagnosis not present

## 2021-10-18 ENCOUNTER — Other Ambulatory Visit (HOSPITAL_BASED_OUTPATIENT_CLINIC_OR_DEPARTMENT_OTHER): Payer: Self-pay | Admitting: Registered Nurse

## 2021-10-18 ENCOUNTER — Telehealth: Payer: Self-pay | Admitting: Cardiovascular Disease

## 2021-10-18 ENCOUNTER — Other Ambulatory Visit: Payer: Self-pay | Admitting: Registered Nurse

## 2021-10-18 DIAGNOSIS — J449 Chronic obstructive pulmonary disease, unspecified: Secondary | ICD-10-CM

## 2021-10-18 DIAGNOSIS — R053 Chronic cough: Secondary | ICD-10-CM

## 2021-10-23 ENCOUNTER — Ambulatory Visit (HOSPITAL_BASED_OUTPATIENT_CLINIC_OR_DEPARTMENT_OTHER)
Admission: RE | Admit: 2021-10-23 | Discharge: 2021-10-23 | Disposition: A | Discharge status: Home / Self Care | Payer: Medicare Other | Source: Ambulatory Visit | Attending: Registered Nurse | Admitting: Registered Nurse

## 2021-10-23 DIAGNOSIS — J449 Chronic obstructive pulmonary disease, unspecified: Secondary | ICD-10-CM

## 2021-10-23 DIAGNOSIS — R053 Chronic cough: Secondary | ICD-10-CM

## 2021-11-22 ENCOUNTER — Telehealth: Payer: Self-pay | Admitting: Cardiovascular Disease

## 2021-11-22 NOTE — Telephone Encounter (Signed)
New Message:      Patient would like to switch from Dr Dr Flora Lipps to Dr Rennis Golden please. Is this alright with you both?

## 2021-12-28 ENCOUNTER — Ambulatory Visit (INDEPENDENT_AMBULATORY_CARE_PROVIDER_SITE_OTHER): Payer: Medicare Other | Admitting: Internal Medicine

## 2021-12-28 ENCOUNTER — Encounter (HOSPITAL_BASED_OUTPATIENT_CLINIC_OR_DEPARTMENT_OTHER): Payer: Self-pay | Admitting: Internal Medicine

## 2021-12-28 VITALS — BP 124/65 | HR 63 | Ht 65.0 in | Wt 187.0 lb

## 2021-12-28 DIAGNOSIS — R471 Dysarthria and anarthria: Secondary | ICD-10-CM | POA: Diagnosis not present

## 2021-12-28 DIAGNOSIS — R4789 Other speech disturbances: Secondary | ICD-10-CM | POA: Diagnosis not present

## 2021-12-28 DIAGNOSIS — Z8679 Personal history of other diseases of the circulatory system: Secondary | ICD-10-CM | POA: Diagnosis not present

## 2021-12-28 NOTE — Patient Instructions (Signed)
Medication Instructions:  NO CHANGES  *If you need a refill on your cardiac medications before your next appointment, please call your pharmacy*   Testing/Procedures: Your physician has requested that you have an echocardiogram. Echocardiography is a painless test that uses sound waves to create images of your heart. It provides your doctor with information about the size and shape of your heart and how well your heart's chambers and valves are working. This procedure takes approximately one hour. There are no restrictions for this procedure.    Follow-Up: At [facility], you and your health needs are our priority.  As part of our continuing mission to provide you with exceptional heart care, we have created designated Provider Care Teams.  These Care Teams include your primary Cardiologist (physician) and Advanced Practice Providers (APPs -  Physician Assistants and Nurse Practitioners) who all work together to provide you with the care you need, when you need it.  We recommend signing up for the patient portal called "MyChart".  Sign up information is provided on this After Visit Summary.  MyChart is used to connect with patients for Virtual Visits (Telemedicine).  Patients are able to view lab/test results, encounter notes, upcoming appointments, etc.  Non-urgent messages can be sent to your provider as well.   To learn more about what you can do with MyChart, go to [email or web address].    Your next appointment:   6 months with Dr. Rennis Golden  Other Instructions You have been referred to Dr. Shon Millet - neurologist

## 2021-12-28 NOTE — Progress Notes (Signed)
OFFICE NOTE  Chief Complaint:  Establish cardiologist, ?TIA's, speech difficulty  Primary Care Physician: Kayla Back, NP  HPI:  Kayla Solomon is a 71 y.o. 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  Her last echo was with cardiovascular consultants limited and demonstrated LVEF of 55-60%.  She had seen my partner Dr. Flora Solomon initially here however I am her husband's cardiologist and she wished to establish care with me.  She was sent for a Myoview stress test as well as a monitor Solomon in September 2022 symptoms after the recent death of her daughter.  It was thought that she had chronic fatigue issues.  She underwent a repeat stress test which was negative and had a calcium score which was 30, 54th percentile for age and sex matched controls.  She has not endorsed any chest pain or worsening shortness of breath however had noticed that she has been having dysarthria with notable slow speech.  She also notes changes in her handwriting and is concerned about whether she might be having TIAs.  Lipid profile showed total cholesterol 159, HDL 55, triglycerides 98 and LDL 85.  PMHx:  Past Medical History:  Diagnosis Date   Asthma    COPD (chronic obstructive pulmonary disease) (HCC)    Diabetes insipidus (HCC)    Diabetes mellitus without complication (HCC)    GERD (gastroesophageal reflux disease)    Hyperlipidemia    Hypertension    Hypothyroidism    OSA (obstructive sleep apnea)     Past Surgical History:  Procedure Laterality Date   SHOULDER SURGERY     ulnar surgery      FAMHx:  Family History  Problem Relation Age of Onset   Heart attack Father 15   Heart attack Maternal Aunt    Stroke Maternal Uncle     SOCHx:   reports that she has never smoked. She has never used smokeless tobacco. She reports that she does not currently  use alcohol. She reports that she does not use drugs.  ALLERGIES:  No Known Allergies  ROS: Pertinent items noted in HPI and remainder of comprehensive ROS otherwise negative.  HOME MEDS: Current Outpatient Medications on File Prior to Visit  Medication Sig Dispense Refill   albuterol (VENTOLIN HFA) 108 (90 Base) MCG/ACT inhaler INHALE 1 PUFF BY MOUTH THREE TIMES DAILY AS NEEDED FOR 30 DAYS     ALPRAZolam (XANAX) 1 MG tablet      amLODipine (NORVASC) 10 MG tablet Take 10 mg by mouth daily.     Ascorbic Acid (VITAMIN C) 1000 MG tablet Take 1,000 mg by mouth daily.     atorvastatin (LIPITOR) 20 MG tablet Take 20 mg by mouth daily.     Biotin 14431 MCG TABS Take 1 capsule by mouth daily.     co-enzyme Q-10 30 MG capsule Take 30 mg by mouth 2 (two) times daily.     Cobalamin Combinations (B-12) [phone] MCG SUBL Take 5,000 mcg by mouth daily. 2 daily     desmopressin (DDAVP) 0.1 MG tablet Take 0.1 mg by mouth 2 (two) times daily.     ferrous sulfate 325 (65 FE) MG tablet Take 325 mg by mouth daily with breakfast.     FLUoxetine (PROZAC) 40 MG capsule Take 40 mg by mouth daily.     fluticasone (FLONASE) 50 MCG/ACT nasal spray USE 1 SPRAY NASALLY TWICE  DAILY     levothyroxine (SYNTHROID) 112 MCG tablet Take 112 mcg by mouth daily before breakfast.     meclizine (ANTIVERT) 12.5 MG tablet Take 12.5 mg by mouth 3 (three) times daily.     montelukast (SINGULAIR) 10 MG tablet Take 10 mg by mouth at bedtime.     nitroGLYCERIN (NITROSTAT) 0.4 MG SL tablet Place 1 tablet (0.4 mg total) under the tongue every 5 (five) minutes as needed for chest pain. 25 tablet 12   pantoprazole (PROTONIX) 40 MG tablet Take 40 mg by mouth daily.     tizanidine (ZANAFLEX) 2 MG capsule Take 2 mg by mouth daily.     traZODone (DESYREL) 100 MG tablet Take 100 mg by mouth at bedtime.     TRELEGY ELLIPTA 100-62.5-25 MCG/ACT AEPB Inhale 1 puff into the lungs daily.     vitamin E 180 MG (400 UNITS) capsule Take 400 Units by  mouth daily. 450 mg (1,000 unit) capsule     No current facility-administered medications on file prior to visit.    LABS/IMAGING: No results found for this or any previous visit (from the past 48 hour(s)). No results found.  LIPID PANEL: No results found for: "CHOL", "TRIG", "HDL", "CHOLHDL", "VLDL", "LDLCALC", "LDLDIRECT"   WEIGHTS: Wt Readings from Last 3 Encounters:  12/28/21 187 lb (84.8 kg)  07/13/21 179 lb (81.2 kg)  04/08/21 176 lb 9.6 oz (80.1 kg)    VITALS: BP 124/65   Pulse 63   Ht 5\' 5"  (1.651 m)   Wt 187 lb (84.8 kg)   SpO2 98%   BMI 31.12 kg/m   EXAM: General appearance: alert and no distress Neck: no carotid bruit, no JVD, and thyroid not enlarged, symmetric, no tenderness/mass/nodules Lungs: clear to auscultation bilaterally Heart: regular rate and rhythm Abdomen: soft, non-tender; bowel sounds normal; no masses,  no organomegaly Extremities: extremities normal, atraumatic, no cyanosis or edema Pulses: 2+ and symmetric Skin: Pale, warm, dry Neurologic: Mental status: Alert, oriented, thought content appropriate, slow speech Psych: Flat affect  EKG: Normal sinus rhythm at 63, nonspecific ST and T wave changes- personally reviewed  ASSESSMENT: Dysarthria, concern for possible TIAs Fatigue 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  PLAN: 1.   Kayla Solomon has been concerned about dysarthria.  There is concern for possible TIAs.  She would like a neurologic evaluation and I recommended Dr. Florentina Solomon.  I sent her husband to Dr. Everlena Solomon with concerns for Parkinson's and he was diagnosed with this formally.  She has had worsening fatigue and may still be struggling with the death of her daughter.  This could be depression with possible psychomotor retardation.  I do think she needs a good neurologic exam with regards to her dysarthria.  She had a reportedly abnormal echocardiogram before concerning for cardiomegaly.  Will repeat that study.  Plan follow-up in 3  to 6 months.  Kayla Leas, MD, [facility], FACP  Kayla Solomon  [facility]  Medical Director of the [facility]  Card[facility]ovascular [facility] Diplomate of the American Board of Clinical Lipidology Attending Cardiologist  Direct Dial: [phone]  Fax: [phone]  Website:  [email or web address] 12/28/2021, 9:37 PM

## 2021-12-29 ENCOUNTER — Ambulatory Visit (INDEPENDENT_AMBULATORY_CARE_PROVIDER_SITE_OTHER): Payer: Medicare Other

## 2021-12-29 DIAGNOSIS — I5030 Unspecified diastolic (congestive) heart failure: Secondary | ICD-10-CM

## 2021-12-29 DIAGNOSIS — I517 Cardiomegaly: Secondary | ICD-10-CM | POA: Diagnosis not present

## 2021-12-29 DIAGNOSIS — I361 Nonrheumatic tricuspid (valve) insufficiency: Secondary | ICD-10-CM | POA: Diagnosis not present

## 2021-12-29 DIAGNOSIS — Z8679 Personal history of other diseases of the circulatory system: Secondary | ICD-10-CM

## 2021-12-29 LAB — ECHOCARDIOGRAM COMPLETE
Area-P 1/2: 3.17 cm2
S' Lateral: 3.11 cm

## 2022-01-11 ENCOUNTER — Encounter: Payer: Self-pay | Admitting: Neurology

## 2022-01-12 ENCOUNTER — Encounter: Payer: Self-pay | Admitting: Internal Medicine

## 2022-01-18 NOTE — Progress Notes (Addendum)
Initial neurology clinic note  Maylin Freeburg MRN: [id number] DOB: 30-Aug-1950  Referring provider: Pixie Casino, MD  Primary care provider: Arthur Holms, NP  Reason for consult:  dysarthria, slow speech, handwriting changes  Subjective:  This is Ms. Taneah Masri, a 71 y.o. left-handed female with a medical history of chronic back pain, DM2, diabetes insipidus, OSA (on CPAP), COPD, HTN, hypothyroidism, OSA, GERD, fibromyalgia who presents to neurology clinic with dysarthria, slow speech, handwriting changes. The patient is accompanied by husband.  Patient has had slow speech for a few years, with gradual progression. She also finds that she is fumbling her words (word finding difficulty). She thinks the word finding is progressing. She does not feel she has cognitive impairment.  Patient endorses handwriting changes over the last year. She mentions she previously had great handwriting but now it is cramped and smaller. She makes more spelling errors.  She also has dizziness for many years. It occurs when she stands or turns her head. It may occur when rolling in bed as well. It feels like the room is spinning. She mentioned seeing audiologist and seeing her eyes were moving when she was dizzy. She has imbalance and staggering when turning. Patient also feels like this has worsened. She denies significant neck pain.  Patient walks with a cane. She states this is due to having multiple spinal problems and chronic back pain.  She denies freezing up or difficulty initiating movements. She denies tremors. She has some difficulty with fine motor movements of the hands due to prior fractures.  Patient mentions that she lives with her mental ill daughter and in the last year lost a daughter due to heart attack. Patient recently moved due to moving in to that place right after her daughter died and the bad associations with the place.   Regarding sleep, patient thinks she usually sleeps well.  Patient has OSA and uses CPAP. She denies morning headaches.  Regarding her mood, she thinks this has improved.  Patient is currently on prozac for depression/anxiety, vit E 180 mg, B12 supplement 5000 mcg, xanax for anxiety, zanaflex (tizanidine)  MEDICATIONS:  Outpatient Encounter Medications as of 01/21/2022  Medication Sig   albuterol (VENTOLIN HFA) 108 (90 Base) MCG/ACT inhaler INHALE 1 PUFF BY MOUTH THREE TIMES DAILY AS NEEDED FOR 30 DAYS   ALPRAZolam (XANAX) 1 MG tablet    amLODipine (NORVASC) 10 MG tablet Take 10 mg by mouth daily.   Ascorbic Acid (VITAMIN C) 1000 MG tablet Take 1,000 mg by mouth daily.   atorvastatin (LIPITOR) 20 MG tablet Take 20 mg by mouth daily.   Biotin 10000 MCG TABS Take 1 capsule by mouth daily.   co-enzyme Q-10 30 MG capsule Take 30 mg by mouth 2 (two) times daily.   Cobalamin Combinations (B-12) [phone] MCG SUBL Take 5,000 mcg by mouth daily. 2 daily   desmopressin (DDAVP) 0.1 MG tablet Take 0.1 mg by mouth 2 (two) times daily.   ferrous sulfate 325 (65 FE) MG tablet Take 325 mg by mouth daily with breakfast.   FLUoxetine (PROZAC) 40 MG capsule Take 40 mg by mouth daily.   fluticasone (FLONASE) 50 MCG/ACT nasal spray USE 1 SPRAY NASALLY TWICE  DAILY   levothyroxine (SYNTHROID) 112 MCG tablet Take 112 mcg by mouth daily before breakfast.   meclizine (ANTIVERT) 12.5 MG tablet Take 12.5 mg by mouth 3 (three) times daily.   montelukast (SINGULAIR) 10 MG tablet Take 10 mg by mouth at bedtime.   nitroGLYCERIN (  NITROSTAT) 0.4 MG SL tablet Place 1 tablet (0.4 mg total) under the tongue every 5 (five) minutes as needed for chest pain.   pantoprazole (PROTONIX) 40 MG tablet Take 40 mg by mouth daily.   tizanidine (ZANAFLEX) 2 MG capsule Take 2 mg by mouth daily.   traZODone (DESYREL) 100 MG tablet Take 100 mg by mouth at bedtime.   TRELEGY ELLIPTA 100-62.5-25 MCG/ACT AEPB Inhale 1 puff into the lungs daily.   vitamin E 180 MG (400 UNITS) capsule Take 400 Units  by mouth daily. 450 mg (1,000 unit) capsule   No facility-administered encounter medications on file as of 01/21/2022.    PAST MEDICAL HISTORY: Past Medical History:  Diagnosis Date   Asthma    COPD (chronic obstructive pulmonary disease) (HCC)    Diabetes insipidus (HCC)    Diabetes mellitus without complication (HCC)    GERD (gastroesophageal reflux disease)    Hyperlipidemia    Hypertension    Hypothyroidism    OSA (obstructive sleep apnea)     PAST SURGICAL HISTORY: Past Surgical History:  Procedure Laterality Date   SHOULDER SURGERY     ulnar surgery      ALLERGIES: No Known Allergies  FAMILY HISTORY: Family History  Problem Relation Age of Onset   Heart attack Father 68   Heart attack Maternal Aunt    Stroke Maternal Uncle     SOCIAL HISTORY: Social History   Tobacco Use   Smoking status: Never   Smokeless tobacco: Never  Vaping Use   Vaping Use: Never used  Substance Use Topics   Alcohol use: Not Currently   Drug use: Never   Social History   Social History Narrative   Left Handed   Caff 2 cups   One story home    Objective:  Vital Signs:  BP 122/71   Pulse 77   Ht 5\' 5"  (1.651 m)   Wt 184 lb (83.5 kg)   SpO2 96%   BMI 30.62 kg/m   General: General appearance: Awake and alert. No distress. Cooperative with exam.  Skin: No obvious rash or jaundice. HEENT: Atraumatic. Anicteric. Lungs: Non-labored breathing on room air Psych: Flat affect  Neurological: Mental Status: Alert. Speech fluent. Able to say months of year backwards. Able to repeat. Able to name. Tangential No pseudobulbar affect Cranial Nerves: CNII: No RAPD. Visual fields intact. CNIII, IV, VI: PERRL. No nystagmus. EOMI. CN V: Facial sensation intact bilaterally to fine touch. Jaw jerk negative CN VII: Facial muscles symmetric and strong. No ptosis at rest. CN VIII: Hears finger rub well bilaterally. CN IX: No hypophonia. CN X: Palate elevates symmetrically. CN XI:  Full strength shoulder shrug bilaterally. CN XII: Tongue protrusion full and midline. No atrophy or fasciculations. No significant dysarthria Motor: Tone is mildly increased on RUE. Paratonia in bilateral lower extremities No fasciculations in extremities. No atrophy. 5/5 in bilateral upper and lower extremities. Reflexes:  Right Left   Bicep 2+ 2+   Tricep 2+ 2+   BrRad 2+ 2+   Knee 2+ 2+   Ankle 1+ 1+    Pathological Reflexes: Babinski: flexor response bilaterally Hoffman: absent bilaterally Troemner: absent bilaterally Palmomental: negative Facial: negative bilaterally Midline tap: negative Sensation: Pinprick: Intact in all extremities Coordination: Intact finger-to- nose-finger bilaterally. Romberg negative. RAM movements normal in all extremities. Gait: Able to rise from chair with arms crossed unassisted. Walks with a cane. Normal arm swing. Normal turning. Narrow-based gait.   Labs and Imaging review: Internal labs: No results  found for: "HGBA1C" No results found for: "VITAMINB12" Lab Results  Component Value Date   TSH 1.85 11/27/2020   External labs: TSH (06/07/21): 2.14 CMP unremarkable  Imaging: None locally or in the last year  Assessment/Plan:  Johnelle Tafolla is a 71 y.o. female who presents for evaluation of slow speech, word finding difficulty, dizziness. She has a relevant medical history of chronic back pain, DM2, diabetes insipidus, OSA (on CPAP), COPD, HTN, hypothyroidism, OSA, GERD, fibromyalgia. Her neurological examination is pertinent for slow speech without clear aphasia. There may be mildly increased tone in the RUE. Etiology of patient's symptoms is currently unclear. She does not have aphasia today or dysarthria, just isolated slow speech. Symptoms have also been slowly progressive. This would not be expected from a stroke. A neurodegenerative disorder such as parkinsonism is a consideration given slowness and handwriting changes, however she has no  signs of parkinsonism on examination. Sleep and mood (depression and anxiety) could be potential etiologies as well, but patient denies that either of these are significant problems today. I will work up as below.  Regarding patient's dizziness, she has vertigo when turning her head or body or changing position. Coupled with the report of her audiologist seeing her "eyes moving while dizzy", this sounds consistent with BBPV.  PLAN: -Blood work: B1, B12, MMA, vit E, copper -MRI brain wo contrast  -Return to clinic in 2 months  The impression above as well as the plan as outlined below were extensively discussed with the patient (in the company of husband) who voiced understanding. All questions were answered to their satisfaction.  The patient was counseled on pertinent fall precautions per the printed material provided today, and as noted under the "Patient Instructions" section below.  When available, results of the above investigations and possible further recommendations will be communicated to the patient via telephone/MyChart. Patient to call office if not contacted after expected testing turnaround time.   Total time spent reviewing records, interview, history/exam, documentation, and coordination of care on day of encounter:  50 min   Thank you for allowing me to participate in patient's care.  If I can answer any additional questions, I would be pleased to do so.  Jacquelyne Balint, MD   CC: Loura Back, NP [address] [place] [address]  CC: Referring provider: Chrystie Nose, MD [address] [place],  [place] [address]

## 2022-01-21 ENCOUNTER — Ambulatory Visit: Payer: Medicare Other | Admitting: Neurology

## 2022-01-21 ENCOUNTER — Encounter: Payer: Self-pay | Admitting: Neurology

## 2022-01-21 ENCOUNTER — Other Ambulatory Visit (INDEPENDENT_AMBULATORY_CARE_PROVIDER_SITE_OTHER): Payer: Medicare Other

## 2022-01-21 VITALS — BP 122/71 | HR 77 | Ht 65.0 in | Wt 184.0 lb

## 2022-01-21 DIAGNOSIS — R42 Dizziness and giddiness: Secondary | ICD-10-CM

## 2022-01-21 DIAGNOSIS — F419 Anxiety disorder, unspecified: Secondary | ICD-10-CM

## 2022-01-21 DIAGNOSIS — G8929 Other chronic pain: Secondary | ICD-10-CM

## 2022-01-21 DIAGNOSIS — F32A Depression, unspecified: Secondary | ICD-10-CM

## 2022-01-21 DIAGNOSIS — M545 Low back pain, unspecified: Secondary | ICD-10-CM

## 2022-01-21 DIAGNOSIS — R4789 Other speech disturbances: Secondary | ICD-10-CM

## 2022-01-21 LAB — VITAMIN B12: Vitamin B-12: 1144 pg/mL — ABNORMAL HIGH (ref 211–911)

## 2022-01-21 NOTE — Patient Instructions (Addendum)
I want to do further investigations into your symptoms.  We will get lab work today.  I am ordering an MRI of your brain. You will be called for this appointment. Let us know if you have not heard from anyone in 2 weeks.  I will be in touch when I have the results of your testing.  I would like to see you back in clinic in 2 months. Please let me know if you have any questions or concerns in the meantime.   The physicians and staff at [facility] are committed to providing excellent care. You may receive a survey requesting feedback about your experience at our office. We strive to receive "very good" responses to the survey questions. If you feel that your experience would prevent you from giving the office a "very good " response, please contact our office to try to remedy the situation. We may be reached at [phone]. Thank you for taking the time out of your busy day to complete the survey.  Kai Levins, MD [facility]  Preventing Falls at [facility] are common, often dreaded events in the lives of older people. Aside from the obvious injuries and even death that may result, fall can cause wide-ranging consequences including loss of independence, mental decline, decreased activity and mobility. Younger people are also at risk of falling, especially those with chronic illnesses and fatigue.  Ways to reduce risk for falling Examine diet and medications. Warm foods and alcohol dilate blood vessels, which can lead to dizziness when standing. Sleep aids, antidepressants and pain medications can also increase the likelihood of a fall.  Get a vision exam. Poor vision, cataracts and glaucoma increase the chances of falling.  Check foot gear. Shoes should fit snugly and have a sturdy, nonskid sole and a broad, low heel  Participate in a physician-approved exercise program to build and maintain muscle strength and improve balance and coordination. Programs that use ankle weights  or stretch bands are excellent for muscle-strengthening. Water aerobics programs and low-impact Tai Chi programs have also been shown to improve balance and coordination.  Increase vitamin D intake. Vitamin D improves muscle strength and increases the amount of calcium the body is able to absorb and deposit in bones.  How to prevent falls from common hazards Floors - Remove all loose wires, cords, and throw rugs. Minimize clutter. Make sure rugs are anchored and smooth. Keep furniture in its usual place.  Chairs -- Use chairs with straight backs, armrests and firm seats. Add firm cushions to existing pieces to add height.  Bathroom - Install grab bars and non-skid tape in the tub or shower. Use a bathtub transfer bench or a shower chair with a back support Use an elevated toilet seat and/or safety rails to assist standing from a low surface. Do not use towel racks or bathroom tissue holders to help you stand.  Lighting - Make sure halls, stairways, and entrances are well-lit. Install a night light in your bathroom or hallway. Make sure there is a light switch at the top and bottom of the staircase. Turn lights on if you get up in the middle of the night. Make sure lamps or light switches are within reach of the bed if you have to get up during the night.  Kitchen - Install non-skid rubber mats near the sink and stove. Clean spills immediately. Store frequently used utensils, pots, pans between waist and eye level. This helps prevent reaching and bending. Sit when getting things  out of lower cupboards.  Living room/ Bedrooms - Place furniture with wide spaces in between, giving enough room to move around. Establish a route through the living room that gives you something to hold onto as you walk.  Stairs - Make sure treads, rails, and rugs are secure. Install a rail on both sides of the stairs. If stairs are a threat, it might be helpful to arrange most of your activities on the lower level to reduce  the number of times you must climb the stairs.  Entrances and doorways - Install metal handles on the walls adjacent to the doorknobs of all doors to make it more secure as you travel through the doorway.  Tips for maintaining balance Keep at least one hand free at all times. Try using a backpack or fanny pack to hold things rather than carrying them in your hands. Never carry objects in both hands when walking as this interferes with keeping your balance.  Attempt to swing both arms from front to back while walking. This might require a conscious effort if Parkinson's disease has diminished your movement. It will, however, help you to maintain balance and posture, and reduce fatigue.  Consciously lift your feet off of the ground when walking. Shuffling and dragging of the feet is a common culprit in losing your balance.  When trying to navigate turns, use a "U" technique of facing forward and making a wide turn, rather than pivoting sharply.  Try to stand with your feet shoulder-length apart. When your feet are close together for any length of time, you increase your risk of losing your balance and falling.  Do one thing at a time. Don't try to walk and accomplish another task, such as reading or looking around. The decrease in your automatic reflexes complicates motor function, so the less distraction, the better.  Do not wear rubber or gripping soled shoes, they might "catch" on the floor and cause tripping.  Move slowly when changing positions. Use deliberate, concentrated movements and, if needed, use a grab bar or walking aid. Count 15 seconds between each movement. For example, when rising from a seated position, wait 15 seconds after standing to begin walking.  If balance is a continuous problem, you might want to consider a walking aid such as a cane, walking stick, or walker. Once you've mastered walking with help, you might be ready to try it on your own again.

## 2022-01-26 LAB — VITAMIN B1: Vitamin B1 (Thiamine): 15 nmol/L (ref 8–30)

## 2022-01-26 LAB — VITAMIN E
Gamma-Tocopherol (Vit E): <1 mg/L (ref <=4.3)
Vitamin E (Alpha Tocopherol): 14.6 mg/L (ref 5.7–19.9)

## 2022-01-26 LAB — COPPER, SERUM

## 2022-01-26 LAB — METHYLMALONIC ACID, SERUM: Methylmalonic Acid, Quant: 75 nmol/L — ABNORMAL LOW (ref 87–318)

## 2022-02-06 ENCOUNTER — Ambulatory Visit
Admission: RE | Admit: 2022-02-06 | Discharge: 2022-02-06 | Disposition: A | Discharge status: Home / Self Care | Payer: Medicare Other | Source: Ambulatory Visit | Attending: Neurology | Admitting: Neurology

## 2022-02-06 DIAGNOSIS — R42 Dizziness and giddiness: Secondary | ICD-10-CM

## 2022-02-06 DIAGNOSIS — R4789 Other speech disturbances: Secondary | ICD-10-CM

## 2022-03-15 NOTE — Progress Notes (Signed)
NEUROLOGY FOLLOW UP OFFICE NOTE  Kayla Solomon [id number]  Subjective:  Kayla Solomon is a 71 y.o. year old female with a history of chronic back pain, DM2, diabetes insipidus, OSA (on CPAP), COPD, HTN, hypothyroidism, OSA, GERD, fibromyalgia who we last saw on 01/21/22.  To briefly review: Patient has had slow speech for a few years, with gradual progression. She also finds that she is fumbling her words (word finding difficulty). She thinks the word finding is progressing. She does not feel she has cognitive impairment.   Patient endorses handwriting changes over the last year (2023). She mentions she previously had great handwriting but now it is cramped and smaller. She makes more spelling errors.   She also has dizziness for many years. It occurs when she stands or turns her head. It may occur when rolling in bed as well. It feels like the room is spinning. She mentioned seeing audiologist and seeing her eyes were moving when she was dizzy. She has imbalance and staggering when turning. Patient also feels like this has worsened. She denies significant neck pain.   Patient walks with a cane. She states this is due to having multiple spinal problems and chronic back pain.   She denies freezing up or difficulty initiating movements. She denies tremors. She has some difficulty with fine motor movements of the hands due to prior fractures.   Patient mentions that she lives with her mental ill daughter and in the last year lost a daughter due to heart attack. Patient recently moved due to moving in to that place right after her daughter died and the bad associations with the place.    Regarding sleep, patient thinks she usually sleeps well. Patient has OSA and uses CPAP. She denies morning headaches.   Regarding her mood, she thinks this has improved.  01/21/22: Assessment: Etiology of patient's symptoms is currently unclear. She does not have aphasia today or dysarthria, just isolated slow  speech. Symptoms have also been slowly progressive. This would not be expected from a stroke. A neurodegenerative disorder such as parkinsonism is a consideration given slowness and handwriting changes, however she has no signs of parkinsonism on examination. Sleep and mood (depression and anxiety) could be potential etiologies as well, but patient denies that either of these are significant problems today. I will work up as below.   Regarding patient's dizziness, she has vertigo when turning her head or body or changing position. Coupled with the report of her audiologist seeing her "eyes moving while dizzy", this sounds consistent with BBPV.   PLAN: -Blood work: B1, B12, MMA, vit E, copper -MRI brain wo contrast  Since their last visit: Patient's dizziness is getting worse. She has fallen 2 times. She was going to sit and missed the seat on one occasion. On the other occasion she was trying to get into bed and fell.  She is getting headaches as well. She points at the front and top of head. She had a history of migraines, but these headaches are much milder. It is an achy pain. It tends to happen in the afternoon (~4 pm). She takes tylenol and the headache will gradually go away. She has headaches most days. She takes tylenol twice weekly.   She endorses significant neck pain.  She went to physical therapy a year ago for balance problems. She has not seen speech therapy. Her swallowing is usually fine (only rare problems).  She still has significant stress from her daughter who lives with them.  Labs after last clinic visit were unremarkable. Of note, copper was not drawn. MRI brain showed no process to explain reported symptoms.  MEDICATIONS:  Outpatient Encounter Medications as of 03/23/2022  Medication Sig   albuterol (VENTOLIN HFA) 108 (90 Base) MCG/ACT inhaler INHALE 1 PUFF BY MOUTH THREE TIMES DAILY AS NEEDED FOR 30 DAYS   ALPRAZolam (XANAX) 1 MG tablet    amLODipine (NORVASC) 10 MG  tablet Take 10 mg by mouth daily.   Ascorbic Acid (VITAMIN C) 1000 MG tablet Take 1,000 mg by mouth daily.   atorvastatin (LIPITOR) 20 MG tablet Take 20 mg by mouth daily.   Biotin 42353 MCG TABS Take 1 capsule by mouth daily.   co-enzyme Q-10 30 MG capsule Take 30 mg by mouth 2 (two) times daily.   Cobalamin Combinations (B-12) [phone] MCG SUBL Take 5,000 mcg by mouth daily. 2 daily   desmopressin (DDAVP) 0.1 MG tablet Take 0.1 mg by mouth 2 (two) times daily.   ferrous sulfate 325 (65 FE) MG tablet Take 325 mg by mouth daily with breakfast.   FLUoxetine (PROZAC) 40 MG capsule Take 40 mg by mouth daily.   fluticasone (FLONASE) 50 MCG/ACT nasal spray USE 1 SPRAY NASALLY TWICE  DAILY   levothyroxine (SYNTHROID) 112 MCG tablet Take 112 mcg by mouth daily before breakfast.   meclizine (ANTIVERT) 12.5 MG tablet Take 12.5 mg by mouth 3 (three) times daily.   montelukast (SINGULAIR) 10 MG tablet Take 10 mg by mouth at bedtime.   nitroGLYCERIN (NITROSTAT) 0.4 MG SL tablet Place 1 tablet (0.4 mg total) under the tongue every 5 (five) minutes as needed for chest pain.   pantoprazole (PROTONIX) 40 MG tablet Take 40 mg by mouth daily.   tizanidine (ZANAFLEX) 2 MG capsule Take 2 mg by mouth daily.   traZODone (DESYREL) 100 MG tablet Take 100 mg by mouth at bedtime.   TRELEGY ELLIPTA 100-62.5-25 MCG/ACT AEPB Inhale 1 puff into the lungs daily.   vitamin E 180 MG (400 UNITS) capsule Take 400 Units by mouth daily. 450 mg (1,000 unit) capsule   No facility-administered encounter medications on file as of 03/23/2022.    PAST MEDICAL HISTORY: Past Medical History:  Diagnosis Date   Asthma    COPD (chronic obstructive pulmonary disease) (HCC)    Diabetes insipidus (HCC)    Diabetes mellitus without complication (HCC)    GERD (gastroesophageal reflux disease)    Hyperlipidemia    Hypertension    Hypothyroidism    OSA (obstructive sleep apnea)     PAST SURGICAL HISTORY: Past Surgical History:   Procedure Laterality Date   SHOULDER SURGERY     ulnar surgery      ALLERGIES: No Known Allergies  FAMILY HISTORY: Family History  Problem Relation Age of Onset   Heart attack Father 23   Heart attack Maternal Aunt    Stroke Maternal Uncle     SOCIAL HISTORY: Social History   Tobacco Use   Smoking status: Never   Smokeless tobacco: Never  Vaping Use   Vaping Use: Never used  Substance Use Topics   Alcohol use: Not Currently   Drug use: Never   Social History   Social History Narrative   Left Handed   Caff 2 cups   One story home      Objective:  Vital Signs:  BP 117/77 (BP Location: Left Arm, Patient Position: Sitting, Cuff Size: Normal)   Pulse 68   Ht 5' 5.5" (1.664 m)   [employer]  189 lb 3.2 oz (85.8 kg)   SpO2 97%   BMI 31.01 kg/m   General: No acute distress.  Patient appears well-groomed.   Head:  Normocephalic/atraumatic Neck: supple, Paraspinal tenderness, Reduced range of motion, particularly turning head to right.  Neurological Exam: Mental status: alert and oriented, speech slow, mild word finding difficulties when attempting to name, not dysarthric.  Cranial nerves: CN I: not tested CN II: pupils equal, round and reactive to light, visual fields intact CN III, IV, VI:  full range of motion, no nystagmus, no ptosis CN V: facial sensation intact. CN VII: upper and lower face symmetric CN VIII: hearing intact CN IX, X: gag intact, uvula midline CN XI: sternocleidomastoid and trapezius muscles intact CN XII: tongue midline  Bulk & Tone: normal, no fasciculations. Motor:  muscle strength 5/5 throughout Deep Tendon Reflexes:  2+ throughout, except at ankles 1+ bilaterally  Sensation:  Sensation intact to light touch. Finger to nose testing:  Without dysmetria.   Gait:  Normal station and stride.  Romberg negative.   Labs and Imaging review: 01/21/22: Normal or unremarkable: vit E, B1, B12 (1144) MMA low at 75  MRI brain wo contrast  (02/06/22): FINDINGS: Brain: Diffusion imaging does not show any acute or subacute infarction. There is mild generalized age related volume loss. No focal abnormality affects the brainstem or cerebellum. Cerebral hemispheres show age related volume loss without subjective lobar predominance. There is no evidence of small vessel disease or old large vessel infarction. No mass, hemorrhage, hydrocephalus or extra-axial collection.   Vascular: Major vessels at the base of the brain show flow.   Skull and upper cervical spine: Normal. Incidental aerated clinoid processes.   Sinuses/Orbits: Paranasal sinuses are clear. Orbits are normal. Extensive mastoid effusion noted on the right. No posterior nasopharyngeal lesion is seen.   Other: None   IMPRESSION: 1. No acute or reversible finding. Age related volume loss. No evidence of small vessel disease or old large vessel infarction. 2. Extensive mastoid effusion on the right. No posterior nasopharyngeal lesion is seen.   Assessment/Plan:  This is Kayla Solomon, a 71 y.o. female with:  Slow speech, word finding difficulties - etiology is currently unclear. Her MRI brain showed no clear cause. This may represent a neurodegenerative cause such as primary progressive aphasia or be a functional neurologic deficit, given her significant stress. There are currently no parkinsonian features (bradykinesia, rigidity, tremor) Headaches and gait imbalance - She has a history of migraines in the past, but these headaches are less severe and may be related to neck pain. She has reduced range of motion and pain to palpation of neck on examination. This may also be causing her balance problems. No obvious signs of myelopathy or radiculopathy on examination.  Plan: -Blood work: copper (was not able to draw at last visit) -PT for neck pain, imbalance -Speech therapy for slow speech, aphasia -May consider imaging of cervical spine if imbalance does not  improve with PT  Return to clinic in 6 months or sooner if needed  Total time spent reviewing records, interview, history/exam, documentation, and coordination of care on day of encounter:  30 min  Jacquelyne Balint, MD

## 2022-03-23 ENCOUNTER — Other Ambulatory Visit (INDEPENDENT_AMBULATORY_CARE_PROVIDER_SITE_OTHER): Payer: Medicare Other

## 2022-03-23 ENCOUNTER — Encounter: Payer: Self-pay | Admitting: Neurology

## 2022-03-23 ENCOUNTER — Ambulatory Visit: Payer: Medicare Other | Admitting: Neurology

## 2022-03-23 VITALS — BP 117/77 | HR 68 | Ht 65.5 in | Wt 189.2 lb

## 2022-03-23 DIAGNOSIS — M542 Cervicalgia: Secondary | ICD-10-CM

## 2022-03-23 DIAGNOSIS — R4701 Aphasia: Secondary | ICD-10-CM

## 2022-03-23 DIAGNOSIS — R42 Dizziness and giddiness: Secondary | ICD-10-CM

## 2022-03-23 DIAGNOSIS — F419 Anxiety disorder, unspecified: Secondary | ICD-10-CM | POA: Diagnosis not present

## 2022-03-23 DIAGNOSIS — R4789 Other speech disturbances: Secondary | ICD-10-CM | POA: Diagnosis not present

## 2022-03-23 DIAGNOSIS — R2681 Unsteadiness on feet: Secondary | ICD-10-CM

## 2022-03-23 DIAGNOSIS — F32A Depression, unspecified: Secondary | ICD-10-CM

## 2022-03-23 DIAGNOSIS — G4486 Cervicogenic headache: Secondary | ICD-10-CM

## 2022-03-23 NOTE — Patient Instructions (Signed)
I want to send one more blood test that was not collected last visit.  I am referring you to physical therapy for neck pain which can cause imbalance and headaches.  I am referring you to speech therapy to evaluate your slow speech and word finding difficulties.  I want to see you back in clinic in 6 months or sooner if needed.  The physicians and staff at [facility] are committed to providing excellent care. You may receive a survey requesting feedback about your experience at our office. We strive to receive "very good" responses to the survey questions. If you feel that your experience would prevent you from giving the office a "very good " response, please contact our office to try to remedy the situation. We may be reached at [phone]. Thank you for taking the time out of your busy day to complete the survey.  Kai Levins, MD [facility]  Preventing Falls at [facility] are common, often dreaded events in the lives of older people. Aside from the obvious injuries and even death that may result, fall can cause wide-ranging consequences including loss of independence, mental decline, decreased activity and mobility. Younger people are also at risk of falling, especially those with chronic illnesses and fatigue.  Ways to reduce risk for falling Examine diet and medications. Warm foods and alcohol dilate blood vessels, which can lead to dizziness when standing. Sleep aids, antidepressants and pain medications can also increase the likelihood of a fall.  Get a vision exam. Poor vision, cataracts and glaucoma increase the chances of falling.  Check foot gear. Shoes should fit snugly and have a sturdy, nonskid sole and a broad, low heel  Participate in a physician-approved exercise program to build and maintain muscle strength and improve balance and coordination. Programs that use ankle weights or stretch bands are excellent for muscle-strengthening. Water aerobics programs  and low-impact Tai Chi programs have also been shown to improve balance and coordination.  Increase vitamin D intake. Vitamin D improves muscle strength and increases the amount of calcium the body is able to absorb and deposit in bones.  How to prevent falls from common hazards Floors - Remove all loose wires, cords, and throw rugs. Minimize clutter. Make sure rugs are anchored and smooth. Keep furniture in its usual place.  Chairs -- Use chairs with straight backs, armrests and firm seats. Add firm cushions to existing pieces to add height.  Bathroom - Install grab bars and non-skid tape in the tub or shower. Use a bathtub transfer bench or a shower chair with a back support Use an elevated toilet seat and/or safety rails to assist standing from a low surface. Do not use towel racks or bathroom tissue holders to help you stand.  Lighting - Make sure halls, stairways, and entrances are well-lit. Install a night light in your bathroom or hallway. Make sure there is a light switch at the top and bottom of the staircase. Turn lights on if you get up in the middle of the night. Make sure lamps or light switches are within reach of the bed if you have to get up during the night.  Kitchen - Install non-skid rubber mats near the sink and stove. Clean spills immediately. Store frequently used utensils, pots, pans between waist and eye level. This helps prevent reaching and bending. Sit when getting things out of lower cupboards.  Living room/ Bedrooms - Place furniture with wide spaces in between, giving enough room to move around. Establish  a route through the living room that gives you something to hold onto as you walk.  Stairs - Make sure treads, rails, and rugs are secure. Install a rail on both sides of the stairs. If stairs are a threat, it might be helpful to arrange most of your activities on the lower level to reduce the number of times you must climb the stairs.  Entrances and doorways  - Install metal handles on the walls adjacent to the doorknobs of all doors to make it more secure as you travel through the doorway.  Tips for maintaining balance Keep at least one hand free at all times. Try using a backpack or fanny pack to hold things rather than carrying them in your hands. Never carry objects in both hands when walking as this interferes with keeping your balance.  Attempt to swing both arms from front to back while walking. This might require a conscious effort if Parkinson's disease has diminished your movement. It will, however, help you to maintain balance and posture, and reduce fatigue.  Consciously lift your feet off of the ground when walking. Shuffling and dragging of the feet is a common culprit in losing your balance.  When trying to navigate turns, use a "U" technique of facing forward and making a wide turn, rather than pivoting sharply.  Try to stand with your feet shoulder-length apart. When your feet are close together for any length of time, you increase your risk of losing your balance and falling.  Do one thing at a time. Don't try to walk and accomplish another task, such as reading or looking around. The decrease in your automatic reflexes complicates motor function, so the less distraction, the better.  Do not wear rubber or gripping soled shoes, they might "catch" on the floor and cause tripping.  Move slowly when changing positions. Use deliberate, concentrated movements and, if needed, use a grab bar or walking aid. Count 15 seconds between each movement. For example, when rising from a seated position, wait 15 seconds after standing to begin walking.  If balance is a continuous problem, you might want to consider a walking aid such as a cane, walking stick, or walker. Once you've mastered walking with help, you might be ready to try it on your own again.

## 2022-03-24 ENCOUNTER — Ambulatory Visit: Payer: Medicare Other | Admitting: Internal Medicine

## 2022-03-26 LAB — COPPER, SERUM: Copper: 133 ug/dL (ref 70–175)

## 2022-04-07 DIAGNOSIS — R42 Dizziness and giddiness: Secondary | ICD-10-CM

## 2022-04-07 DIAGNOSIS — R5382 Chronic fatigue, unspecified: Secondary | ICD-10-CM

## 2022-04-07 DIAGNOSIS — I509 Heart failure, unspecified: Secondary | ICD-10-CM

## 2022-04-07 DIAGNOSIS — D509 Iron deficiency anemia, unspecified: Secondary | ICD-10-CM | POA: Insufficient documentation

## 2022-04-07 DIAGNOSIS — K449 Diaphragmatic hernia without obstruction or gangrene: Secondary | ICD-10-CM

## 2022-04-07 DIAGNOSIS — G4700 Insomnia, unspecified: Secondary | ICD-10-CM

## 2022-04-07 DIAGNOSIS — H9193 Unspecified hearing loss, bilateral: Secondary | ICD-10-CM

## 2022-04-07 DIAGNOSIS — R296 Repeated falls: Secondary | ICD-10-CM

## 2022-04-07 DIAGNOSIS — F1320 Sedative, hypnotic or anxiolytic dependence, uncomplicated: Secondary | ICD-10-CM

## 2022-04-07 DIAGNOSIS — M8580 Other specified disorders of bone density and structure, unspecified site: Secondary | ICD-10-CM

## 2022-04-07 DIAGNOSIS — I739 Peripheral vascular disease, unspecified: Secondary | ICD-10-CM

## 2022-04-07 DIAGNOSIS — I209 Angina pectoris, unspecified: Secondary | ICD-10-CM

## 2022-05-03 ENCOUNTER — Other Ambulatory Visit: Payer: Self-pay

## 2022-05-03 ENCOUNTER — Ambulatory Visit: Payer: Medicare HMO | Attending: Neurology

## 2022-05-03 DIAGNOSIS — R4701 Aphasia: Secondary | ICD-10-CM | POA: Diagnosis not present

## 2022-05-03 DIAGNOSIS — F444 Conversion disorder with motor symptom or deficit: Secondary | ICD-10-CM | POA: Insufficient documentation

## 2022-05-03 DIAGNOSIS — R482 Apraxia: Secondary | ICD-10-CM | POA: Insufficient documentation

## 2022-05-03 NOTE — Therapy (Signed)
OUTPATIENT SPEECH LANGUAGE PATHOLOGY APHASIA EVALUATION   Patient Name: Kayla Solomon MRN: [id number] DOB:10-22-1950, 72 y.o., female Today's Date: 05/03/2022  PCP: Kayla Holms, NP REFERRING PROVIDER: Shellia Carwin, MD  END OF SESSION:  End of Session - 05/03/22 1310     Visit Number 1    Number of Visits 9    Date for SLP Re-Evaluation 07/07/22    SLP Start Time 0806    SLP Stop Time  0847    SLP Time Calculation (min) 41 min    Activity Tolerance Patient tolerated treatment well             Past Medical History:  Diagnosis Date   Asthma    COPD (chronic obstructive pulmonary disease) ([place])    Diabetes insipidus ([place])    Diabetes mellitus without complication ([place])    GERD (gastroesophageal reflux disease)    Hyperlipidemia    Hypertension    Hypothyroidism    OSA (obstructive sleep apnea)    Past Surgical History:  Procedure Laterality Date   SHOULDER SURGERY     ulnar surgery     Patient Active Problem List   Diagnosis Date Noted   Hypertension 04/12/2021   OSA on CPAP 01/14/2021   DOE (dyspnea on exertion) 11/27/2020    ONSET DATE: "about a year ago"   REFERRING DIAG: R47.01 (ICD-10-CM) - Aphasia  THERAPY DIAG:  Aphasia  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 Berdine Addison, neurologist, notes from 03-23-22: This is Kayla Solomon, a 72 y.o. female with:  Slow speech, word finding difficulties - etiology is currently unclear. Her MRI brain showed no clear cause. This may represent a neurodegenerative cause such as primary progressive aphasia or be a functional neurologic deficit, given her significant stress. There are currently no parkinsonian features (bradykinesia, rigidity, tremor) Headaches and gait imbalance - She has a history of  migraines in the past, but these headaches are less severe and may be related to neck pain. She has reduced range of motion and pain to palpation of neck on examination. This may also be causing her balance problems. No obvious signs of myelopathy or radiculopathy on examination. Plan: -Blood work: copper (was not able to draw at last visit) -PT for neck pain, imbalance -Speech therapy for slow speech, aphasia -May consider imaging of cervical spine if imbalance does not improve with PT  PAIN:  Are you having pain? No  FALLS: Has patient fallen in last 6 months?  No  LIVING ENVIRONMENT: Lives with: lives with their spouse Lives in: House/apartment  PLOF:  Level of assistance: Independent with ADLs, Independent with IADLs Employment: Retired  PATIENT GOALS: Improve speech output  OBJECTIVE:   DIAGNOSTIC FINDINGS:  MRI 02/08/22: IMPRESSION: 1. No acute or reversible finding. Age related volume loss. No evidence of small vessel disease or old large vessel infarction. 2. Extensive mastoid effusion on the right. No posterior nasopharyngeal lesion is seen.   COGNITION: Overall cognitive status: Within functional limits for tasks assessed   AUDITORY COMPREHENSION: Overall auditory comprehension: Appears intact  READING COMPREHENSION: Intact  EXPRESSION: verbal  VERBAL EXPRESSION: Level of generative/spontaneous verbalization: conversation Automatic speech: name: intact, social response: intact, day of week: intact, and month of year: intact  Repetition: Appears intact, even with multisyllabic words and sentences laden with multisyllabic words. Naming: Confrontation: WNL Pragmatics:  Appears intact Comments: Pt states that she has semantic dysnomia with "yes/no" frequently but did not show any difficulty with anomia or dysnomia today, even with multiple "yes/no" questions asked by SLP during evaluation. Interfering components:  possible psych component?  WRITTEN  EXPRESSION: Dominant hand: left Written expression: Appears intact  MOTOR SPEECH: Overall motor speech: impaired Level of impairment: Word Respiration: thoracic breathing and diaphragmatic/abdominal breathing Phonation: normal Resonance: WFL Articulation: Impaired: word Intelligibility: Intelligible Motor planning: Appears intact Motor speech errors: aware and consistent Interfering components:  possible psych component Comments: Pt's articulation is 100% intact - no dysarthria/misarticulation/imprecise consonants in conversation today, nor with word repetition, even with sentences laden with multisyllabic words  ORAL MOTOR EXAMINATION: Overall status: Impaired:   Labial: Bilateral (Coordination) Lingual: Bilateral (Coordination) Comments: Circular lingual sweep in upper and lower labial sulci, and licking lips in a circular pattern appeared WNL  PATIENT REPORTED OUTCOME MEASURES (PROM): Communication Effectiveness Survey: or Communication Participation Survey, to be completed in first 1-2 sessions   TODAY'S TREATMENT:                                                                                                                                         DATE:  05/03/22 (eval): SLP discussed results of evaluation with pt, and suggested that based on today's performance and a [facility] MRI in October that 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 SLP provided pt with a list of consonant-laden phrases for her in order to provide practice with articulation in many different articulatory contexts.   PATIENT EDUCATION: Education details: see "today's treatment" Person educated: Patient Education method: Explanation and Handouts Education comprehension: verbalized understanding, returned demonstration, verbal cues required, and needs further education   GOALS: Goals reviewed with patient?  Yes  SHORT TERM GOALS: Target date: 06/07/22  Pt will demo HEP with independence Baseline: Goal status: INITIAL  2.  Pt will maintain speech rate over 10 minutes simple conversation Baseline:  Goal status: INITIAL  3.  Pt will demo language tasks (V-NEST, SFA) that she can complete at home with rare min A Baseline:  Goal status: INITIAL  4.  Pt will indicate she has moved forward in scheduling psychologist, counselor, or neuropsychological consult/evaluation Baseline:  Goal status: INITIAL   LONG TERM GOALS: Target date: 07/07/22  Pt will maintain speech rate over 15 minutes simple to mod complex conversation in 2 sessions Baseline:  Goal status: INITIAL  2.  Pt will demo HEP with independence in total of two sessions Baseline:  Goal status: INITIAL  3.  Pt will demo independence with home language tasks (V-NEST, SFA) in two sessions Baseline:  Goal status: INITIAL  4.  Pt will achieve higher PROM score in the last 1-2 sessions of ST than at initial administration  Baseline:  Goal status: INITIAL   ASSESSMENT:  CLINICAL IMPRESSION: Patient is a 72 y.o. female who was seen today for assessment of speech difficulty resulting in slowed but precise speech rate. 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 Pt has had incidents since 2022 which have significantly impacted her psychologically. 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 Pt related to SLP today that she often states "no" when she means "yes" however SLP asked a myriad of yes/no questions today during evaluation after pt shared this and pt answered correctly 100% of the time. When asked, pt states she coughs with liquids approx once every two weeks, which is not at a rate that raises concern with SLP at this time. A neuropsychological evaluation may be warranted at this time for more definitive diagnosis and decision if  counseling may be beneficial for pt to alleviate her speech sx.  OBJECTIVE IMPAIRMENTS: include expressive language, aphasia, and apraxia. These impairments are limiting patient from household responsibilities, ADLs/IADLs, and effectively communicating at home and in community. Factors affecting potential to achieve goals and functional outcome are co-morbidities (psych component?). Patient will benefit from skilled SLP services to address above impairments and improve overall function.  REHAB POTENTIAL: Good  PLAN:  SLP FREQUENCY: 1-2x/week  SLP DURATION: 8 weeks  PLANNED INTERVENTIONS: Language facilitation, Oral motor exercises, Functional tasks, SLP instruction and feedback, Compensatory strategies, and Patient/family education    [facility], CCC-SLP 05/03/2022, 3:34 PM

## 2022-05-03 NOTE — Patient Instructions (Signed)
   Speech Exercises  Repeat these phrases 2 times, 2 times a day  Call the cat "Buttercup" A calendar of [place], [place] Four floors to cover Yellow oil ointment Fellow lovers of felines Catastrophe in [place]' plums The church's chimes chimed Telling time 'til eleven Five valve levers Keep the gate closed Go see that guy Fat cows give milk [employer] Gophers Fat frogs flip freely Kohl's into bed Get that game to [employer] Thick thistles stick together Cinnamon aluminum linoleum Black bugs blood Lovely lemon linament Red leather, yellow leather  Big grocery buggy    Purple baby carriage [facility] copper coffee pot Ripe purple cabbage Three free throws [employer] tackled  [employer] dipped the dessert  [place] that [employer] of [employer] Shirts shrink, shells shouldn't [place] [employer] Take the tackle box File the flash message Give me five flapjacks Fundamental relatives Dye the pets purple Talking [place] time after time Dark chocolate chunks Political landscape of the kingdom Estate manager/land agent genius We played yo-yos yesterday

## 2022-05-04 ENCOUNTER — Other Ambulatory Visit: Payer: Self-pay

## 2022-05-04 DIAGNOSIS — R4789 Other speech disturbances: Secondary | ICD-10-CM

## 2022-05-04 DIAGNOSIS — R4701 Aphasia: Secondary | ICD-10-CM

## 2022-05-13 ENCOUNTER — Encounter: Payer: Self-pay | Admitting: Psychology

## 2022-05-16 ENCOUNTER — Ambulatory Visit: Payer: Medicare HMO | Admitting: Psychology

## 2022-05-16 ENCOUNTER — Encounter: Payer: Self-pay | Admitting: Psychology

## 2022-05-16 ENCOUNTER — Ambulatory Visit: Payer: Medicare HMO

## 2022-05-16 DIAGNOSIS — R4189 Other symptoms and signs involving cognitive functions and awareness: Secondary | ICD-10-CM

## 2022-05-16 DIAGNOSIS — G3184 Mild cognitive impairment, so stated: Secondary | ICD-10-CM | POA: Diagnosis not present

## 2022-05-16 DIAGNOSIS — F411 Generalized anxiety disorder: Secondary | ICD-10-CM | POA: Diagnosis not present

## 2022-05-16 DIAGNOSIS — F459 Somatoform disorder, unspecified: Secondary | ICD-10-CM | POA: Diagnosis not present

## 2022-05-16 NOTE — Progress Notes (Signed)
NEUROPSYCHOLOGICAL EVALUATION [place] [place] Department of Neurology  Date of Evaluation: May 16, 2022  Reason for Referral:   Kayla BisonJudith Solomon is a 72 y.o. left-handed Caucasian female referred by  Jacquelyne BalintJeremy Hill, M.D. , to characterize her current cognitive functioning and assist with diagnostic clarity and treatment planning in the context of slowed verbal fluency and expressive language decline.   Assessment and Plan:   Clinical Impression(s): [name]'s pattern of performance is suggestive of a primary impairment surrounding processing speed. An additional weakness was exhibited across executive functioning, primarily surrounding cognitive flexibility and response inhibition. Performances were adequate relative to age-matched peers across attention/concentration, abstract reasoning, safety/judgment, receptive and expressive language, visuospatial abilities, and all aspects of learning and memory. [name] denied difficulties completing instrumental activities of daily living (ADLs) independently. As such, given evidence for cognitive dysfunction described above, she meets criteria for a Mild Neurocognitive Disorder ("mild cognitive impairment") at the present time.  The etiology for ongoing dysfunction is unclear as her pattern of deficits is fairly nonspecific in nature. Neurologically speaking, a frontal subcortical pattern of dysfunction can be seen in primary vascular etiologies, as well as Parkinson's disease and other parkinsonian conditions. However, specific to the former, her recent brain MRI did not reveal a prior infarct or prominent microvascular ischemia. Specific to the latter, [name] generally denied the presence of any parkinsonian symptoms and mild balance instability could be better accounted for by osteoarthritis and various orthopedic pain surrounding her feet, knees, and back. Neurocognitive testing in isolation is not sufficient to diagnosis a  movement disorder and serves only as an accompanying feature when other physical characteristics are present.   Conversationally, [name] does exhibit a slowed rate of speech. However, performance across all receptive and expressive language tasks were appropriate throughout the current evaluation relative to age-matched peers. No handwriting difficulties, evidence for micrographia, or spelling errors were exhibited across writing samples. This mirrors generally strong performances across a recent examination performed by a speech and Solicitorlanguage pathologist. Despite her outward presentation, current testing (as well as unremarkable neuroimaging) does not suggest the presence of a primary progressive aphasia (PPA) presentation at the current time. Likewise, consistently strong memory performances is not suggestive of Alzheimer's disease. She also did not display any behavioral features concerning for Lewy body disease (e.g., REM sleep behaviors, fully-formed visual hallucinations) or the behavioral variant of frontotemporal lobar degeneration.   There remains the potential for a non-neurological cause for ongoing dysfunction. [name] scored in the severe range across a questionnaire assessing somatic complaints and subsequent mental preoccupation. 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 Deficits surrounding processing speed and executive functioning are certainly a reasonable finding with this more psychiatric presentation. Similar deficits can be seen in individuals dealing with chronic pain, sleep dysfunction, medication side effects (i.e., Xanax), significant psychosocial stressors, and psychiatric distress. It may be that the combination of these factors currently represents the best explanation for test performances. Continued medical monitoring will be important moving forward.   Recommendations: A repeat neuropsychological evaluation in 24-36 months (or sooner if  functional decline is noted) is recommended to assess the trajectory of future cognitive decline should it occur. This will also aid in future efforts towards improved diagnostic clarity.  If members of her medical team have greater concerns surrounding a parkinsonian condition, they could consider referrals for an alpha-synuclein skin biopsy or additional neuroimaging in the form of a DaTscan to better rule out this  type of illness.   A combination of medication and psychotherapy has been shown to be most effective at treating symptoms of anxiety and depression. As such, [name] is encouraged to speak with her prescribing physician regarding medication adjustments to optimally manage these symptoms. Likewise, [name] is encouraged to consider engaging in short-term psychotherapy to address symptoms of psychiatric distress and various life stressors. She would benefit from an active and collaborative therapeutic environment, rather than one purely supportive in nature. Recommended treatment modalities include Cognitive Behavioral Therapy (CBT) or Acceptance and Commitment Therapy (ACT).  [name] is encouraged to attend to lifestyle factors for brain health (e.g., regular physical exercise, good nutrition habits, regular participation in cognitively-stimulating activities, and general stress management techniques), which are likely to have benefits for both emotional adjustment and cognition. In fact, in addition to promoting good general health, regular exercise incorporating aerobic activities (e.g., brisk walking, jogging, cycling, etc.) has been demonstrated to be a very effective treatment for depression and stress, with similar efficacy rates to both antidepressant medication and psychotherapy. Optimal control of vascular risk factors (including safe cardiovascular exercise and adherence to dietary recommendations) is encouraged. Likewise, continued compliance with her CPAP machine will also be  important. Continued participation in activities which provide mental stimulation and social interaction is also recommended.   If interested, there are some activities which have therapeutic value and can be useful in keeping her cognitively stimulated. For suggestions, Ms. Leaton is encouraged to go to the following website: https://www.[facility].org/get-to-know-barrow/centers-programs/neurorehabilitation-center/neuro-rehab-apps-and-games/ which has options, categorized by level of difficulty. It should be noted that these activities should not be viewed as a substitute for therapy.  To address problems with processing speed, she may wish to consider:   -Ensuring that she is alerted when essential material or instructions are being presented   -Adjusting the speed at which new information is presented   -Allowing for more time in comprehending, processing, and responding in conversation   -Repeating and paraphrasing instructions or conversations aloud  To address problems with fluctuating attention and executive dysfunction, she may wish to consider:   -Avoiding external distractions when needing to concentrate   -Limiting exposure to fast paced environments with multiple sensory demands   -Writing down complicated information and using checklists   -Attempting and completing one task at a time (i.e., no multi-tasking)   -Verbalizing aloud each step of a task to maintain focus   -Reducing the amount of information considered at one time  Review of Records:   [name] was seen by [facility] Jacquelyne Balint, M.D.) on 01/21/2022 for an evaluation of dysarthria, slowed speech, and handwriting changes. Records suggest that [name] has experienced slowed speech for several years with gradual progression. She also described more broad word finding difficulties. Outside of expressive language, no other cognitive dysfunction was reported. She noted that her handwriting has worsened over the past  year where it now seems smaller and with a greater number of spelling errors. [name] did acknowledge being under a large degree of stress stemming from multiple sources. Dr. Adaline Sill neurological exam was said to suggest slowed speech without evidence for aphasia. [name] was ultimately referred for speech therapy to more directly assess expressive language dysfunction.  Brain MRI on 02/08/2022 was unremarkable.   She was evaluated by Speech and Language Pathology Verdie Mosher, CCC-SLP) on 05/03/2022. Her primary complaint at that time surrounded her perception that her "speech has become draining." She appeared to perform very well across all aspects  of Mr. Gar Ponto initial evaluation. No prominent abnormalities were noted.   Ultimately, [name] was referred for a comprehensive neuropsychological evaluation to characterize her cognitive abilities and to assist with diagnostic clarity and treatment planning.   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   Hiatal hernia 04/07/2022   Hypercholesterolemia 01/23/2018   Hyperlipidemia, unspecified 03/30/2018   Hypertension, essential 02/12/2018   Hypothyroidism    Insomnia 04/07/2022   Iron deficiency anemia 04/07/2022   Irritable bowel syndrome 01/23/2018   Mild persistent asthma 04/10/2018   Obstructive sleep apnea 01/23/2018   Osteopenia 04/07/2022   Osteoporosis 01/23/2018   Peripheral vascular disease 04/07/2022   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   Polyp of nasal cavity 04/10/2018   Recurrent falls 04/07/2022   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   Stage 3 chronic kidney disease 06/28/2016   Supraventricular tachycardia 01/23/2019    Past Surgical History:  Procedure Laterality Date   SHOULDER SURGERY     ulnar surgery      Current Outpatient Medications:    albuterol (VENTOLIN HFA) 108 (90 Base) MCG/ACT inhaler, INHALE 1 PUFF BY MOUTH THREE TIMES DAILY AS NEEDED FOR 30 DAYS, Disp: , Rfl:    ALPRAZolam (XANAX) 1 MG tablet, , Disp: , Rfl:    amLODipine (NORVASC) 10 MG tablet, Take 10 mg by mouth daily., Disp: , Rfl:    Ascorbic Acid (VITAMIN C) 1000 MG tablet, Take 1,000 mg by mouth daily., Disp: , Rfl:    atorvastatin (LIPITOR) 20 MG tablet, Take 20 mg by mouth daily., Disp: , Rfl:    Biotin 10000 MCG TABS, Take 1 capsule by mouth daily., Disp: , Rfl:    co-enzyme Q-10 30 MG capsule, Take 30 mg by mouth 2 (two) times daily., Disp: , Rfl:    Cobalamin Combinations (B-12) [phone] MCG SUBL, Take 5,000 mcg by mouth daily. 2 daily, Disp: , Rfl:    desmopressin (DDAVP) 0.1 MG tablet, Take 0.1 mg by mouth 2 (two) times daily., Disp: , Rfl:    ferrous sulfate 325 (65 FE) MG tablet, Take 325 mg by mouth daily with breakfast., Disp: , Rfl:    FLUoxetine (PROZAC) 40 MG capsule, Take 40 mg by mouth daily., Disp: , Rfl:    fluticasone (FLONASE) 50 MCG/ACT nasal spray, USE 1 SPRAY NASALLY TWICE  DAILY, Disp: , Rfl:    levothyroxine (SYNTHROID) 112 MCG tablet, Take 112 mcg  by mouth  daily before breakfast., Disp: , Rfl:    meclizine (ANTIVERT) 12.5 MG tablet, Take 12.5 mg by mouth 3 (three) times daily., Disp: , Rfl:    montelukast (SINGULAIR) 10 MG tablet, Take 10 mg by mouth at bedtime., Disp: , Rfl:    nitroGLYCERIN (NITROSTAT) 0.4 MG SL tablet, Place 1 tablet (0.4 mg total) under the tongue every 5 (five) minutes as needed for chest pain., Disp: 25 tablet, Rfl: 12   pantoprazole (PROTONIX) 40 MG tablet, Take 40 mg by mouth daily., Disp: , Rfl:    tizanidine (ZANAFLEX) 2 MG capsule, Take 2 mg by mouth daily., Disp: , Rfl:    traZODone (DESYREL) 100 MG tablet, Take 100 mg by mouth at bedtime., Disp: , Rfl:    TRELEGY ELLIPTA 100-62.5-25 MCG/ACT AEPB, Inhale 1 puff into the lungs daily., Disp: , Rfl:    vitamin E 180 MG (400 UNITS) capsule, Take 400 Units by mouth daily. 450 mg (1,000 unit) capsule, Disp: , Rfl:   Clinical Interview:   The following information was obtained during a clinical interview with [name] and her husband prior to cognitive testing.  Cognitive Symptoms: Decreased short-term memory: Denied. Decreased long-term memory: Denied. Decreased attention/concentration: Denied. Her husband did describe instances where she will seemingly lose her train of thought.  Reduced processing speed: Denied. Difficulties with executive functions: Denied outside of perhaps some increased distractibility when she is attempting to multi-task. She and her husband denied trouble with impulsivity or any significant personality changes.  Difficulties with emotion regulation: Denied. Difficulties with receptive language: Denied. Difficulties with word finding: Endorsed. Most saliently, she described diminished speech output, noting that her rate of speech had slowed considerably and that speech felt "draining" at times. She also described instances where she will provide incorrect word substitutions while speaking, as well as more general word finding issues.  Difficulties were said to be present for the past 1-2 years. She generally described dysfunction as stable rather than exhibiting gradual progression. Decreased visuoperceptual ability: Denied.  Difficulties completing ADLs: Denied.  Additional Medical History: History of traumatic brain injury/concussion: Denied. History of stroke: Denied. History of seizure activity: Denied. History of known exposure to toxins: Denied. Symptoms of chronic pain: Endorsed. She described fairly diffuse symptoms of osteoarthritis. She also described pain and other discomfort involving her feet and back. She also described a history of scoliosis. Pain symptoms were generally described as manageable overall. Experience of frequent headaches/migraines: Denied. She reported a remote history of severe migraine headaches in her late teens and early 27s. Frequent instances of dizziness/vertigo: Endorsed. Symptoms were primarily said to occur when bending over. She noted that these symptoms had led to falls or instances where she needs to catch herself in the past. The cause for occasional bouts of dizziness was unknown.   Sensory changes: She wears reading glasses and utilizes hearing aids with benefit. Other sensory changes/difficulties (e.g., taste or smell) were denied.  Balance/coordination difficulties: Endorsed. Balance instability was attributed to a combination of foot/knee pain, back pain and several disc-related issues, degenerative osteoarthritis, and dizziness. She did not report any recent falls.  Other motor difficulties: Denied.  Sleep History: Estimated hours obtained each night: 7-8 hours.  Difficulties falling asleep: Denied. Difficulties staying asleep: Denied. However, she did describe "waking up in the middle of the night to take my pills." It was unclear which pills she was referring to. She reported being generally able to fall back asleep quickly.  Feels rested and refreshed upon awakening:  Endorsed.  History of snoring: Endorsed. History of waking up gasping for air: Endorsed. Witnessed breath cessation while asleep: Endorsed. She reported a history of obstructive sleep apnea and uses her CPAP machine nightly.   History of vivid dreaming: Denied. Excessive movement while asleep: Denied. Instances of acting out her dreams: Denied.  Psychiatric/Behavioral Health History: Depression: She denied current or remote symptoms of depression. However, medical records do suggest a prior diagnosis of a depressive disorder and current medication intervention. She did describe significant biopsychosocial stressors. She noted that one daughter suddenly passed away via heart attack two years prior. Her other daughter has severe mental health concerns and has been hospitalized psychiatrically many times over the years. She also expressed stress surrounding both her and her husband's health. Reading represented her primary coping mechanism. Current or remote suicidal ideation, intent, or plan was denied.  Anxiety: Denied. Medical records do suggest a history of generalized anxious distress and current medication intervention.  Mania: Denied. Trauma History: Denied. Visual/auditory hallucinations: Denied. Delusional thoughts: Denied.  Tobacco: Denied. Alcohol: She reported very infrequent alcohol consumption and denied a history of problematic alcohol abuse or dependence.  Recreational drugs: Denied.  Family History: Problem Relation Age of Onset   Heart attack Father 1   Epilepsy Sister        skull fx in early childhood   Heart attack Maternal Aunt    Stroke Maternal Uncle    This information was confirmed by Ms. Electronics engineer.  Academic/Vocational History: Highest level of educational attainment: 16 years. She reported being described as a "genius" in the past, scoring in the top 3% on an unspecified standardized test in the distant past. This could not be verified. She completed high school  and earned a Water quality scientist degree in Visual merchandiser. She described herself as a Production designer, theatre/television/film in academic settings. No relative weaknesses were reported.  History of developmental delay: Denied. History of grade repetition: Denied. Enrollment in special education courses: Denied. History of LD/ADHD: Denied.  Employment: Retired. 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.   Evaluation Results:   Behavioral Observations: [name] was accompanied by her husband, arrived to her appointment on time, and was appropriately dressed and groomed. She appeared alert and oriented. Observed gait and station were within normal limits. Gross motor functioning appeared intact upon informal observation and no abnormal movements (e.g., tremors) were noted. Her affect was generally relaxed and positive. Her rate of speech was slowed. Outside of this, she was fluent and word finding difficulties were not observed during the clinical interview. Thought processes were coherent, organized, and normal in content. Insight into her cognitive difficulties appeared adequate.   During testing, sustained attention was appropriate. Task engagement was adequate and she persisted when challenged. Overall, [name] was cooperative with the clinical interview and subsequent testing procedures.   Adequacy of Effort: The validity of neuropsychological testing is limited by the extent to which the individual being tested may be assumed to have exerted adequate effort during testing. [name] expressed her intention to perform to the best of her abilities and exhibited adequate task engagement and persistence. Scores across stand-alone and embedded performance validity measures were within expectation. As such, the results of the current evaluation  are believed to be a valid representation of [name] current cognitive functioning.  Test Results: Ms. [place] was oriented at  the time of the current evaluation.  Intellectual abilities based upon educational and vocational attainment were estimated to be in the average range. Premorbid abilities were estimated to be within the average range based upon a single-word reading test.   Processing speed was exceptionally low. Of note, across a very basic visuomotor sequencing task requiring her to draw a line from numbers 1-25 in order, she exhibited a very highly elevated error rate. Said error rate is extremely uncommon and I do not have a good explanation for its occurrence. Basic attention was average. More complex attention (e.g., working memory) was below average to average. Executive functioning was exceptionally low to below average. She did perform in the well above average range across a task assessing safety and judgment.   Assessed receptive language abilities were above average. Likewise, [name] did not exhibit any difficulties comprehending task instructions and answered all questions asked of her appropriately. Assessed expressive language (e.g., verbal fluency and confrontation naming) was mildly variable but overall appropriate, ranging from the below average to above average normative ranges. Writing samples were appropriate with no evidence of agrammatism, spelling errors, or micrographia.    Assessed visuospatial/visuoconstructional abilities were below average to average.    Learning (i.e., encoding) of novel verbal information was above average to well above average. Spontaneous delayed recall (i.e., retrieval) of previously learned information was average to well above average. Retention rates were 100% across a story learning task, 90% across a list learning task, and 75% across a figure drawing task. Performance across recognition tasks was appropriate, suggesting evidence for  information consolidation.   Results of emotional screening instruments suggested that recent symptoms of generalized anxiety were in the mild range, while symptoms of depression were within normal limits. She scored in the severe range across a task assessing somatic symptom preoccupation. A screening instrument assessing recent sleep quality suggested the presence of minimal sleep dysfunction.  Tables of Scores:   Note: This summary of test scores accompanies the interpretive report and should not be considered in isolation without reference to the appropriate sections in the text. Descriptors are based on appropriate normative data and may be adjusted based on clinical judgment. Terms such as "Within Normal Limits" and "Outside Normal Limits" are used when a more specific description of the test score cannot be determined.       Percentile - Normative Descriptor > 98 - Exceptionally High 91-97 - Well Above Average 75-90 - Above Average 25-74 - Average 9-24 - Below Average 2-8 - Well Below Average < 2 - Exceptionally Low       Validity:   DESCRIPTOR       TOMM: --- --- Within Normal Limits  Dot Counting Test: --- --- Within Normal Limits  RBANS Effort Index: --- --- Within Normal Limits  WAIS-IV Reliable Digit Span: --- --- Within Normal Limits       Orientation:      Raw Score Percentile   NAB Orientation, Form 1 28/29 --- ---       Cognitive Screening:      Raw Score Percentile   SLUMS: 26/30 --- ---       RBANS, Form A: Standard Score/ Scaled Score Percentile   Total Score 96 39 Average  Immediate Memory 120 91 Well Above Average    List Learning 12 75 Above Average    Story Memory 15 95 Well Above Average  Visuospatial/Constructional 81 10 Below Average    Figure Copy 7 16 Below Average  Line Orientation 14/20 17-25 Below Average to Average  Language 96 39 Average    Picture Naming 10/10 51-75 Average    Semantic Fluency 8 25 Average  Attention 79 8 Well Below  Average    Digit Span 10 50 Average    Coding 3 1 Exceptionally Low  Delayed Memory 113 81 Above Average    List Recall 9/10 >75 Above Average    List Recognition 20/20 51-75 Average    Story Recall 15 95 Well Above Average    Story Recognition 12/12 69+ Average    Figure Recall 10 50 Average    Figure Recognition 7/8 53-69 Average        Intellectual Functioning:      Standard Score Percentile   Test of Premorbid Functioning: 108 70 Average       Attention/Executive Function:     Trail Making Test (TMT): Raw Score (T Score) Percentile     Part A 79 secs.,  6 errors (25) 1 Exceptionally Low    Part B 206 secs.,  4 errors (32) 4 Well Below Average         Scaled Score Percentile   WAIS-IV Digit Span: 8 25 Average    Forward 9 37 Average    Backward 8 25 Average    Sequencing 7 16 Below Average        Scaled Score Percentile   WAIS-IV Similarities: 16 98 Exceptionally High       D-KEFS Color-Word Interference Test: Raw Score (Scaled Score) Percentile     Color Naming 60 secs. (1) <1 Exceptionally Low    Word Reading 40 secs. (3) 1 Exceptionally Low    Inhibition 136 secs. (1) <1 Exceptionally Low      Total Errors 22 errors (1) <1 Exceptionally Low    Inhibition/Switching 109 secs. (6) 9 Below Average      Total Errors 15 errors (1) <1 Exceptionally Low       D-KEFS Verbal Fluency Test: Raw Score (Scaled Score) Percentile     Letter Total Correct 33 (9) 37 Average    Category Total Correct 27 (8) 25 Average    Category Switching Total Correct 9 (6) 9 Below Average    Category Switching Accuracy 7 (6) 9 Below Average      Total Set Loss Errors 3 (9) 37 Average      Total Repetition Errors 2 (11) 63 Average       NAB Executive Functions Module, Form 1: T Score Percentile     Judgment 65 93 Well Above Average       Language:      Raw Score Percentile   PPVT Screening Instrument: 16/16 --- Within Normal Limits  Sentence Repetition: 14/22 27 Average       Verbal  Fluency Test: Raw Score (T Score) Percentile     Phonemic Fluency (FAS) 33 (40) 16 Below Average    Animal Fluency 15 (41) 18 Below Average        NAB Language Module, Form 1: Standard Score/ T Score Percentile   Total Score 111 77 Above Average    Oral Production 53 62 Average    Auditory Comprehension 57 75 Above Average    Reading Comprehension Raw = 13/13 --- Within Normal Limits    Naming 31/31 (58) 79 Above Average    Writing 52 58 Average    Bill Payment 53 62 Average       Visuospatial/Visuoconstruction:      Raw Score  Percentile   Clock Drawing: 9/10 --- Within Normal Limits        Scaled Score Percentile   WAIS-IV Block Design: 7 16 Below Average       Mood and Personality:      Raw Score Percentile   Geriatric Depression Scale: 6 --- Within Normal Limits  Geriatric Anxiety Scale: 16 --- Mild    Somatic 9 --- Mild    Cognitive 4 --- Mild    Affective 3 --- Minimal       Additional Questionnaires:      Raw Score Percentile   PHQ-15: 16 --- Severe  PROMIS Sleep Disturbance Questionnaire: 13 --- None to Slight   Informed Consent and Coding/Compliance:   The current evaluation represents a clinical evaluation for the purposes previously outlined by the referral source and is in no way reflective of a forensic evaluation.   [name] was provided with a verbal description of the nature and purpose of the present neuropsychological evaluation. Also reviewed were the foreseeable risks and/or discomforts and benefits of the procedure, limits of confidentiality, and mandatory reporting requirements of this provider. The patient was given the opportunity to ask questions and receive answers about the evaluation. Oral consent to participate was provided by the patient.   This evaluation was conducted by Newman NickelsZachary C. , Ph.D., ABPP-CN, board certified clinical neuropsychologist. [name] completed a clinical interview with Dr. Milbert Coulter, billed as one unit [phone], and 175 minutes of  cognitive testing and scoring, billed as one unit [phone] and five additional units 96139. Psychometrist Wallace Kellerana Chamberlain, B.S., assisted Dr. Milbert Coulter with test administration and scoring procedures. As a separate and discrete service, Dr. Milbert Coulter spent a total of 160 minutes in interpretation and report writing billed as one unit [phone] and two units 96133.

## 2022-05-16 NOTE — Progress Notes (Signed)
   Psychometrician Note   Cognitive testing was administered to Kayla Solomon by Kayla Solomon, B.S. (psychometrist) under the supervision of Dr. Christia Reading, Ph.D., licensed psychologist on 05/16/2022. Kayla Solomon did not appear overtly distressed by the testing session per behavioral observation or responses across self-report questionnaires. Rest breaks were offered.    The battery of tests administered was selected by Dr. Christia Reading, Ph.D. with consideration to Kayla Solomon's current level of functioning, the nature of her symptoms, emotional and behavioral responses during interview, level of literacy, observed level of motivation/effort, and the nature of the referral question. This battery was communicated to the psychometrist. Communication between Dr. Christia Reading, Ph.D. and the psychometrist was ongoing throughout the evaluation and Dr. Christia Reading, Ph.D. was immediately accessible at all times. Dr. Christia Reading, Ph.D. provided supervision to the psychometrist on the date of this service to the extent necessary to assure the quality of all services provided.    Kayla Solomon will return within approximately 1-2 weeks for an interactive feedback session with Dr. Melvyn Novas at which time her test performances, clinical impressions, and treatment recommendations will be reviewed in detail. Kayla Solomon understands she can contact our office should she require our assistance before this time.  A total of 175 minutes of billable time were spent face-to-face with Kayla Solomon by the psychometrist. This includes both test administration and scoring time. Billing for these services is reflected in the clinical report generated by Dr. Christia Reading, Ph.D.  This note reflects time spent with the psychometrician and does not include test scores or any clinical interpretations made by Dr. Melvyn Novas. The full report will follow in a separate note.

## 2022-05-17 ENCOUNTER — Encounter: Payer: Self-pay | Admitting: Psychology

## 2022-05-31 ENCOUNTER — Ambulatory Visit: Payer: Medicare HMO

## 2022-06-03 ENCOUNTER — Ambulatory Visit: Payer: Medicare HMO | Admitting: Psychology

## 2022-06-03 DIAGNOSIS — F459 Somatoform disorder, unspecified: Secondary | ICD-10-CM

## 2022-06-03 DIAGNOSIS — G3184 Mild cognitive impairment, so stated: Secondary | ICD-10-CM

## 2022-06-03 NOTE — Progress Notes (Signed)
   Neuropsychology Feedback Session Tillie Rung. [facility]  Reason for Referral:   Kayla Solomon is a 72 y.o. left-handed Caucasian female referred by  Kai Levins, M.D. , to characterize her current cognitive functioning and assist with diagnostic clarity and treatment planning in the context of slowed verbal fluency and expressive language decline.   Feedback:   Kayla Solomon completed a comprehensive neuropsychological evaluation on 05/16/2022. Please refer to that encounter for the full report and recommendations. Briefly, results suggested a primary impairment surrounding processing speed. An additional weakness was exhibited across executive functioning, primarily surrounding cognitive flexibility and response inhibition. There remains the potential for a non-neurological cause for ongoing dysfunction. Kayla Solomon scored in the severe range across a questionnaire assessing somatic complaints and subsequent mental preoccupation. 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 Deficits surrounding processing speed and executive functioning are certainly a reasonable finding with this more psychiatric presentation. Similar deficits can be seen in individuals dealing with chronic pain, sleep dysfunction, medication side effects (i.e., Xanax), significant psychosocial stressors, and psychiatric distress. It may be that the combination of these factors currently represents the best explanation for test performances. Continued medical monitoring will be important moving forward.    Kayla Solomon was accompanied by her husband during the current feedback session. Content of the current session focused on the results of her neuropsychological evaluation. Kayla Solomon was given the opportunity to ask questions and her questions were answered. She was encouraged to reach out should additional questions arise. A copy of her report was provided at the conclusion  of the visit.      20 minutes were spent conducting the current feedback session with Kayla Solomon, billed as one unit [phone].

## 2022-06-08 NOTE — Therapy (Signed)
[facility] [address], [place], [place], [address] Phone: [phone]   Fax:  [phone]  Patient Details  Name: Kayla Solomon MRN: [id number] Date of Birth: May 22, 1950 Referring Provider:  Kai Levins, MD  Encounter Date: 06/08/2022  SPEECH THERAPY DISCHARGE SUMMARY  Visits from Start of Care: 1 (eval)  Current functional level related to goals / functional outcomes: Pt cancelled appointments with SLP originally made after evaluation on 05-03-22. Pt will thus be discharged.  She had evaluation with Dr. Melvyn Novas, neuropsychologist after ST evaluation, which stated possible psychological component for pt's sx.  Goals and plan from 05-03-22 ST evaluation were as follows: SHORT TERM GOALS: Target date: 06/07/22   Pt will demo HEP with independence Baseline: Goal status: INITIAL   2.  Pt will maintain speech rate over 10 minutes simple conversation Baseline:  Goal status: INITIAL   3.  Pt will demo language tasks (V-NEST, SFA) that she can complete at home with rare min A Baseline:  Goal status: INITIAL   4.  Pt will indicate she has moved forward in scheduling psychologist, counselor, or neuropsychological consult/evaluation Baseline:  Goal status: INITIAL     LONG TERM GOALS: Target date: 07/07/22   Pt will maintain speech rate over 15 minutes simple to mod complex conversation in 2 sessions Baseline:  Goal status: INITIAL   2.  Pt will demo HEP with independence in total of two sessions Baseline:  Goal status: INITIAL   3.  Pt will demo independence with home language tasks (V-NEST, SFA) in two sessions Baseline:  Goal status: INITIAL   4.  Pt will achieve higher PROM score in the last 1-2 sessions of ST than at initial administration  Baseline:  Goal status: INITIAL     ASSESSMENT:   CLINICAL IMPRESSION: Patient is a 72 y.o. female who was seen today for assessment of speech difficulty resulting in  slowed but precise speech rate. 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 Pt has had incidents since 2022 which have significantly impacted her psychologically. 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 Pt related to SLP today that she often states "no" when she means "yes" however SLP asked a myriad of yes/no questions today during evaluation after pt shared this and pt answered correctly 100% of the time. When asked, pt states she coughs with liquids approx once every two weeks, which is not at a rate that raises concern with SLP at this time. A neuropsychological evaluation may be warranted at this time for more definitive diagnosis and decision if counseling may be beneficial for pt to alleviate her speech sx.   Remaining deficits: Assumed deficits remain.    Education / Equipment: Results of evaluation, basics of neuropsychology due to suggestion made by SLP for that referral.   Patient agrees to discharge. Patient goals were not met. Patient is being discharged due to the patient's request..    [place], [place] 06/08/2022, 1:56 PM  [facility] [address] [place], [place], [address] Phone: [phone]   Fax:  [phone]

## 2022-06-09 NOTE — Telephone Encounter (Signed)
Erroneous encounter

## 2022-07-14 ENCOUNTER — Other Ambulatory Visit: Payer: Self-pay | Admitting: Registered Nurse

## 2022-07-14 DIAGNOSIS — R918 Other nonspecific abnormal finding of lung field: Secondary | ICD-10-CM

## 2022-08-22 ENCOUNTER — Encounter: Payer: Self-pay | Admitting: Registered Nurse

## 2022-08-23 ENCOUNTER — Ambulatory Visit
Admission: RE | Admit: 2022-08-23 | Discharge: 2022-08-23 | Disposition: A | Discharge status: Home / Self Care | Payer: Medicare HMO | Source: Ambulatory Visit | Attending: Registered Nurse | Admitting: Registered Nurse

## 2022-08-23 DIAGNOSIS — R918 Other nonspecific abnormal finding of lung field: Secondary | ICD-10-CM

## 2022-09-14 NOTE — Progress Notes (Deleted)
NEUROLOGY FOLLOW UP OFFICE NOTE  Germaine Sitzes [id number]  Subjective:  Dynetta Bayliff is a 72 y.o. year old female with a history of chronic back pain, DM2, diabetes insipidus, OSA (on CPAP), COPD, HTN, hypothyroidism, OSA, GERD, fibromyalgia who we last saw on 03/23/22.  To briefly review: Patient has had slow speech for a few years, with gradual progression. She also finds that she is fumbling her words (word finding difficulty). She thinks the word finding is progressing. She does not feel she has cognitive impairment.   Patient endorses handwriting changes over the last year (2023). She mentions she previously had great handwriting but now it is cramped and smaller. She makes more spelling errors.   She also has dizziness for many years. It occurs when she stands or turns her head. It may occur when rolling in bed as well. It feels like the room is spinning. She mentioned seeing audiologist and seeing her eyes were moving when she was dizzy. She has imbalance and staggering when turning. Patient also feels like this has worsened. She denies significant neck pain.   Patient walks with a cane. She states this is due to having multiple spinal problems and chronic back pain.   She denies freezing up or difficulty initiating movements. She denies tremors. She has some difficulty with fine motor movements of the hands due to prior fractures.   Patient mentions that she lives with her mental ill daughter and in the last year lost a daughter due to heart attack. Patient recently moved due to moving in to that place right after her daughter died and the bad associations with the place.    Regarding sleep, patient thinks she usually sleeps well. Patient has OSA and uses CPAP. She denies morning headaches.   Regarding her mood, she thinks this has improved.  03/23/22: Patient's dizziness is getting worse. She has fallen 2 times. She was going to sit and missed the seat on one occasion. On the other  occasion she was trying to get into bed and fell.   She is getting headaches as well. She points at the front and top of head. She had a history of migraines, but these headaches are much milder. It is an achy pain. It tends to happen in the afternoon (~4 pm). She takes tylenol and the headache will gradually go away. She has headaches most days. She takes tylenol twice weekly.    She endorses significant neck pain.   She went to physical therapy a year ago for balance problems. She has not seen speech therapy. Her swallowing is usually fine (only rare problems).   She still has significant stress from her daughter who lives with them.   Labs after last clinic visit were unremarkable. Of note, copper was not drawn. MRI brain showed no process to explain reported symptoms.  Most recent Assessment and Plan (03/23/22): This is Malaijah Lessard, a 72 y.o. female with:  Slow speech, word finding difficulties - etiology is currently unclear. Her MRI brain showed no clear cause. This may represent a neurodegenerative cause such as primary progressive aphasia or be a functional neurologic deficit, given her significant stress. There are currently no parkinsonian features (bradykinesia, rigidity, tremor) Headaches and gait imbalance - She has a history of migraines in the past, but these headaches are less severe and may be related to neck pain. She has reduced range of motion and pain to palpation of neck on examination. This may also be causing her balance problems.  No obvious signs of myelopathy or radiculopathy on examination.   Plan: -Blood work: copper (was not able to draw at last visit) -PT for neck pain, imbalance -Speech therapy for slow speech, aphasia -May consider imaging of cervical spine if imbalance does not improve with PT  Since their last visit: Neuropsych testing was completed on 05/16/22 and consistent with mild cognitive impairment with strong performance in receptive and expressive  language (see full results below).  PT***  Speech therapy***  MEDICATIONS:  Outpatient Encounter Medications as of 09/28/2022  Medication Sig   albuterol (VENTOLIN HFA) 108 (90 Base) MCG/ACT inhaler INHALE 1 PUFF BY MOUTH THREE TIMES DAILY AS NEEDED FOR 30 DAYS   ALPRAZolam (XANAX) 1 MG tablet    amLODipine (NORVASC) 10 MG tablet Take 10 mg by mouth daily.   Ascorbic Acid (VITAMIN C) 1000 MG tablet Take 1,000 mg by mouth daily.   atorvastatin (LIPITOR) 20 MG tablet Take 20 mg by mouth daily.   Biotin 40981 MCG TABS Take 1 capsule by mouth daily.   co-enzyme Q-10 30 MG capsule Take 30 mg by mouth 2 (two) times daily.   Cobalamin Combinations (B-12) [phone] MCG SUBL Take 5,000 mcg by mouth daily. 2 daily   desmopressin (DDAVP) 0.1 MG tablet Take 0.1 mg by mouth 2 (two) times daily.   ferrous sulfate 325 (65 FE) MG tablet Take 325 mg by mouth daily with breakfast.   FLUoxetine (PROZAC) 40 MG capsule Take 40 mg by mouth daily.   fluticasone (FLONASE) 50 MCG/ACT nasal spray USE 1 SPRAY NASALLY TWICE  DAILY   levothyroxine (SYNTHROID) 112 MCG tablet Take 112 mcg by mouth daily before breakfast.   meclizine (ANTIVERT) 12.5 MG tablet Take 12.5 mg by mouth 3 (three) times daily.   montelukast (SINGULAIR) 10 MG tablet Take 10 mg by mouth at bedtime.   nitroGLYCERIN (NITROSTAT) 0.4 MG SL tablet Place 1 tablet (0.4 mg total) under the tongue every 5 (five) minutes as needed for chest pain.   pantoprazole (PROTONIX) 40 MG tablet Take 40 mg by mouth daily.   tizanidine (ZANAFLEX) 2 MG capsule Take 2 mg by mouth daily.   traZODone (DESYREL) 100 MG tablet Take 100 mg by mouth at bedtime.   TRELEGY ELLIPTA 100-62.5-25 MCG/ACT AEPB Inhale 1 puff into the lungs daily.   vitamin E 180 MG (400 UNITS) capsule Take 400 Units by mouth daily. 450 mg (1,000 unit) capsule   No facility-administered encounter medications on file as of 09/28/2022.    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   Hiatal hernia 04/07/2022   Hypercholesterolemia 01/23/2018   Hyperlipidemia, unspecified 03/30/2018   Hypertension, essential 02/12/2018   Hypothyroidism    Insomnia 04/07/2022   Iron deficiency anemia 04/07/2022   Irritable bowel syndrome 01/23/2018   Mild cognitive impairment of uncertain or unknown etiology 05/16/2022   Mild persistent asthma 04/10/2018   Obstructive sleep apnea 01/23/2018   Osteopenia 04/07/2022   Osteoporosis 01/23/2018   Peripheral vascular disease 04/07/2022   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   Polyp of nasal cavity 04/10/2018   Recurrent falls 04/07/2022   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   Stage 3 chronic kidney disease 06/28/2016   Supraventricular tachycardia 01/23/2019    PAST SURGICAL HISTORY: Past Surgical History:  Procedure Laterality Date   SHOULDER SURGERY     ulnar surgery      ALLERGIES: No Known Allergies  FAMILY HISTORY: Family History  Problem Relation Age of Onset    Heart attack Father 61   Epilepsy Sister        skull fx in early childhood   Heart attack Maternal Aunt    Stroke Maternal Uncle     SOCIAL HISTORY: Social History   Tobacco Use   Smoking status: Never   Smokeless tobacco: Never  Vaping Use   Vaping Use: Never used  Substance Use Topics   Alcohol use: Not Currently   Drug use: Never   Social History   Social History Narrative   Left Handed   Caff 2 cups   One story home with daughter and husband   retired      Objective:  Vital Signs:  There were no vitals taken for this visit.  ***  Labs and Imaging review: New results: Copper (03/23/22): wnl  External labs (06/17/22): Vit D low at 26 TSH wnl BMP unremarkable  Neuropsych testing (05/16/22): Clinical Impression(s): Ms. Carnathan pattern of performance is suggestive of a primary impairment surrounding processing speed. An additional weakness was exhibited across executive functioning, primarily surrounding cognitive flexibility and response inhibition. Performances were adequate relative to age-matched peers across attention/concentration, abstract reasoning, safety/judgment, receptive and expressive language, visuospatial abilities, and all aspects of learning and memory. Ms. Picou denied difficulties completing instrumental activities of daily living (ADLs) independently. As such, given evidence for cognitive dysfunction described above, she meets criteria for a Mild Neurocognitive Disorder ("mild cognitive impairment") at the present time.   The etiology for ongoing dysfunction is unclear as her pattern of deficits is fairly nonspecific in nature. Neurologically speaking, a frontal subcortical pattern of dysfunction can be seen in primary vascular etiologies, as well as Parkinson's disease and other parkinsonian conditions. However, specific to the former, her recent brain MRI did not reveal a prior infarct or prominent microvascular ischemia. Specific to the latter, Ms.  Bonura generally denied the presence of any parkinsonian symptoms and mild balance instability could be better accounted for by osteoarthritis and various orthopedic pain surrounding her feet, knees, and back. Neurocognitive testing in isolation is not sufficient to diagnosis a movement disorder and serves only as an accompanying feature when other physical characteristics are present.    Conversationally, Ms. Jacobsen does exhibit a slowed rate of speech. However, performance across all receptive and expressive language tasks were appropriate throughout the current evaluation relative to age-matched peers. No handwriting difficulties, evidence for micrographia, or spelling errors were exhibited across writing samples.  This mirrors generally strong performances across a recent examination performed by a speech and Solicitor. Despite her outward presentation, current testing (as well as unremarkable neuroimaging) does not suggest the presence of a primary progressive aphasia (PPA) presentation at the current time. Likewise, consistently strong memory performances is not suggestive of Alzheimer's disease. She also did not display any behavioral features concerning for Lewy body disease (e.g., REM sleep behaviors, fully-formed visual hallucinations) or the behavioral variant of frontotemporal lobar degeneration.    There remains the potential for a non-neurological cause for ongoing dysfunction. Ms. Ghosh scored in the severe range across a questionnaire assessing somatic complaints and subsequent mental preoccupation. 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 Deficits surrounding processing speed and executive functioning are certainly a reasonable finding with this more psychiatric presentation. Similar deficits can be seen in individuals dealing with chronic pain, sleep dysfunction, medication side effects (i.e., Xanax), significant psychosocial stressors, and psychiatric  distress. It may be that the combination of these factors currently represents the best explanation for test performances. Continued medical monitoring will be important moving forward.    Recommendations: A repeat neuropsychological evaluation in 24-36 months (or sooner if functional decline is noted) is recommended to assess the trajectory of future cognitive decline should it occur. This will also aid in future efforts towards improved diagnostic clarity.  Previously reviewed results: 01/21/22: Normal or unremarkable: vit E, B1, B12 (1144) MMA low at 75   MRI brain wo contrast (02/06/22): FINDINGS: Brain: Diffusion imaging does not show any acute or subacute infarction. There is mild generalized age related volume loss. No focal abnormality affects the brainstem or cerebellum. Cerebral hemispheres show age related volume loss without subjective lobar predominance. There is no evidence of small vessel disease or old large vessel infarction. No mass, hemorrhage, hydrocephalus or extra-axial collection.   Vascular: Major vessels at the base of the brain show flow.   Skull and upper cervical spine: Normal. Incidental aerated clinoid processes.   Sinuses/Orbits: Paranasal sinuses are clear. Orbits are normal. Extensive mastoid effusion noted on the right. No posterior nasopharyngeal lesion is seen.   Other: None   IMPRESSION: 1. No acute or reversible finding. Age related volume loss. No evidence of small vessel disease or old large vessel infarction. 2. Extensive mastoid effusion on the right. No posterior nasopharyngeal lesion is seen.  Assessment/Plan:  This is Margarethe Schoenberger, a 72 y.o. female with: ***   Plan: ***  Return to clinic in ***  Total time spent reviewing records, interview, history/exam, documentation, and coordination of care on day of encounter:  *** min  Jacquelyne Balint, MD

## 2022-09-18 ENCOUNTER — Other Ambulatory Visit: Payer: Self-pay

## 2022-09-18 ENCOUNTER — Ambulatory Visit (HOSPITAL_COMMUNITY)
Admission: EM | Admit: 2022-09-18 | Discharge: 2022-09-18 | Disposition: A | Discharge status: Home / Self Care | Payer: Medicare HMO | Attending: Emergency Medicine | Admitting: Emergency Medicine

## 2022-09-18 ENCOUNTER — Encounter (HOSPITAL_COMMUNITY): Payer: Self-pay | Admitting: Emergency Medicine

## 2022-09-18 DIAGNOSIS — S00451A Superficial foreign body of right ear, initial encounter: Secondary | ICD-10-CM

## 2022-09-18 MED ORDER — LIDOCAINE HCL (PF) 1 % IJ SOLN
INTRAMUSCULAR | Status: AC
  Filled 2022-09-18: qty 2

## 2022-09-18 MED ORDER — CEPHALEXIN 500 MG PO CAPS: 500.0000 mg | ORAL_CAPSULE | Freq: Two times a day (BID) | ORAL | 0 refills | Status: AC

## 2022-09-18 NOTE — Discharge Instructions (Addendum)
Today we removed an embedded earring from your right ear lobe. Please keep the area clean and dry.  You can apply topical antibacterial ointment.  I am covering you with oral antibiotics to prevent against infection.  Please take all antibiotics as prescribed and until finished.    Please return to clinic if you develop any new or concerning signs of infection.

## 2022-09-18 NOTE — ED Provider Notes (Signed)
[facility]    CSN: [id number] Arrival date & time: 09/18/22  1206      History   Chief Complaint Chief Complaint  Patient presents with   Foreign Body    Pt states she has to remove her earrings for surgery on Tuesday and they are stock on her ear lobe, redness, swollen noticed on ear lobe.    HPI Kayla Solomon is a 72 y.o. female.   Patient reports she has a upcoming procedure tomorrow and was told to remove all of her jewelry.  She was able to get her embedded earring out of her left ear, however, she was unable to get her embedded diamond earring out of her right ear.  Presents to clinic with help for earring removal.  She has been digging at it, area is red with dried scabs.  The history is provided by the patient and medical records.  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   Hiatal hernia 04/07/2022   Hypercholesterolemia 01/23/2018   Hyperlipidemia, unspecified 03/30/2018    Hypertension, essential 02/12/2018   Hypothyroidism    Insomnia 04/07/2022   Iron deficiency anemia 04/07/2022   Irritable bowel syndrome 01/23/2018   Mild cognitive impairment of uncertain or unknown etiology 05/16/2022   Mild persistent asthma 04/10/2018   Obstructive sleep apnea 01/23/2018   Osteopenia 04/07/2022   Osteoporosis 01/23/2018   Peripheral vascular disease 04/07/2022   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   Polyp of nasal cavity 04/10/2018   Recurrent falls 04/07/2022   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   Stage 3 chronic kidney disease 06/28/2016   Supraventricular tachycardia 01/23/2019    Patient Active Problem List   Diagnosis Date Noted   Mild cognitive impairment of uncertain or unknown etiology 05/16/2022   Angina pectoris 04/07/2022   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   Chronic fatigue 04/07/2022   Congestive heart failure (CHF) 04/07/2022   Dizziness 04/07/2022   Hiatal hernia 04/07/2022   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   Insomnia 04/07/2022   Iron deficiency anemia 04/07/2022   Osteopenia 04/07/2022   Recurrent falls 04/07/2022   Peripheral vascular disease 04/07/2022   COPD (chronic obstructive pulmonary disease) 03/20/2019   Benzodiazepine dependence 03/05/2019   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   Supraventricular tachycardia 01/23/2019   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   Dyspnea on exertion 04/10/2018   Polyp of nasal cavity 04/10/2018   Allergic rhinitis 04/10/2018  Mild persistent asthma 04/10/2018   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   Chronic left shoulder pain 03/30/2018   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      Home Medications    Prior to Admission medications   Medication Sig Start Date End Date Taking? Authorizing Provider  cephALEXin (KEFLEX) 500 MG capsule Take 1 capsule (500 mg total) by mouth 2 (two) times daily for 5 days. 09/18/22 09/23/22 Yes Rinaldo Ratel, [place] N, FNP  albuterol (VENTOLIN HFA) 108 (90 Base) MCG/ACT inhaler INHALE 1 PUFF BY MOUTH THREE TIMES DAILY AS NEEDED FOR 30 DAYS    [provider]  ALPRAZolam Prudy Feeler) 1 MG tablet     [provider]  amLODipine (NORVASC) 10 MG tablet Take 10 mg by mouth daily. 12/16/21   [provider]  Ascorbic Acid (VITAMIN C) 1000 MG tablet Take 1,000 mg by mouth daily.    [provider]  atorvastatin (LIPITOR) 20 MG tablet Take 20 mg by mouth daily.    [provider]  Biotin 16109 MCG TABS Take 1 capsule by mouth daily.    [provider]  co-enzyme Q-10 30 MG capsule Take 30 mg by mouth 2 (two) times daily.    [provider]  Cobalamin Combinations (B-12) [phone] MCG SUBL Take 5,000 mcg by mouth daily. 2 daily    [provider]  desmopressin (DDAVP) 0.1 MG tablet Take 0.1 mg by mouth 2 (two) times daily.    [provider]  ferrous sulfate 325 (65 FE) MG tablet Take 325 mg by mouth daily with breakfast.    [provider]  FLUoxetine (PROZAC) 40 MG capsule Take 40 mg by mouth daily.    [provider]  fluticasone (FLONASE) 50 MCG/ACT nasal spray USE 1 SPRAY NASALLY TWICE  DAILY    [provider]  levothyroxine (SYNTHROID) 112 MCG tablet Take 112 mcg by mouth daily before breakfast.    [provider]  meclizine (ANTIVERT) 12.5 MG tablet Take 12.5 mg by mouth 3 (three) times daily. 08/23/21    [provider]  montelukast (SINGULAIR) 10 MG tablet Take 10 mg by mouth at bedtime.    [provider]  nitroGLYCERIN (NITROSTAT) 0.4 MG SL tablet Place 1 tablet (0.4 mg total) under the tongue every 5 (five) minutes as needed for chest pain. 04/08/21   Sande Rives, MD  pantoprazole (PROTONIX) 40 MG tablet Take 40 mg by mouth daily.    [provider]  tizanidine (ZANAFLEX) 2 MG capsule Take 2 mg by mouth daily.    [provider]  traZODone (DESYREL) 100 MG tablet Take 100 mg by mouth at bedtime.    [provider]  TRELEGY ELLIPTA 100-62.5-25 MCG/ACT AEPB Inhale 1 puff into the lungs daily. 10/27/21   [provider]  vitamin E 180 MG (400 UNITS) capsule Take 400 Units by mouth daily. 450 mg (1,000 unit) capsule    [provider]  Family History Family History  Problem Relation Age of Onset   Heart attack Father 30   Epilepsy Sister        skull fx in early childhood   Heart attack Maternal Aunt    Stroke Maternal Uncle     Social History Social History   Tobacco Use   Smoking status: Never   Smokeless tobacco: Never  Vaping Use   Vaping Use: Never used  Substance Use Topics   Alcohol use: Not Currently   Drug use: Never     Allergies   Patient has no known allergies.   Review of Systems Review of Systems  Constitutional:  Negative for fever.  Skin:  Positive for wound.     Physical Exam Triage Vital Signs ED Triage Vitals  Enc Vitals Group     BP 09/18/22 1227 112/69     Pulse Rate 09/18/22 1227 90     Resp 09/18/22 1227 18     Temp 09/18/22 1227 98.3 F (36.8 C)     Temp Source 09/18/22 1227 Oral     SpO2 09/18/22 1227 96 %     Weight 09/18/22 1228 189 lb 9.5 oz (86 kg)     Height 09/18/22 1228 5\' 6"  (1.676 m)     Head Circumference --      Peak Flow --      Pain Score 09/18/22 1228 4     Pain Loc --      Pain Edu? --      Excl. in GC? --    No data found.  Updated Vital  Signs BP 112/69 (BP Location: Right Arm)   Pulse 90   Temp 98.3 F (36.8 C) (Oral)   Resp 18   Ht 5\' 6"  (1.676 m)   Wt 189 lb 9.5 oz (86 kg)   SpO2 96%   BMI 30.60 kg/m   Visual Acuity Right Eye Distance:   Left Eye Distance:   Bilateral Distance:    Right Eye Near:   Left Eye Near:    Bilateral Near:     Physical Exam Vitals and nursing note reviewed.  Constitutional:      Appearance: Normal appearance.  HENT:     Head: Normocephalic and atraumatic.     Ears:      Nose: Nose normal.     Mouth/Throat:     Mouth: Mucous membranes are moist.  Eyes:     Conjunctiva/sclera: Conjunctivae normal.  Cardiovascular:     Rate and Rhythm: Normal rate.  Pulmonary:     Effort: Pulmonary effort is normal. No respiratory distress.  Skin:    General: Skin is warm and dry.     Capillary Refill: Capillary refill takes less than 2 seconds.  Neurological:     General: No focal deficit present.     Mental Status: She is alert and oriented to person, place, and time.  Psychiatric:        Mood and Affect: Mood normal.        Behavior: Behavior normal.      UC Treatments / Results  Labs (all labs ordered are listed, but only abnormal results are displayed) Labs Reviewed - No data to display  EKG   Radiology No results found.  Procedures Foreign Body Removal  Date/Time: 09/18/2022 1:16 PM  Performed by: , [place] N, FNP Authorized by: , [place] N, FNP   Consent:    Consent obtained:  Verbal   Consent given by:  Patient  Risks, benefits, and alternatives were discussed: yes     Risks discussed:  Bleeding, infection, poor cosmetic result and incomplete removal   Alternatives discussed:  No treatment Universal protocol:    Procedure explained and questions answered to patient or proxy's satisfaction: yes     Patient identity confirmed:  Verbally with patient Location:    Location:  Ear   Ear location:  R ear Pre-procedure details:    Imaging:   None   Neurovascular status: intact   Anesthesia:    Anesthesia method:  Local infiltration   Local anesthetic:  Lidocaine 1% w/o epi Post-procedure details:    Neurovascular status: intact     Confirmation:  No additional foreign bodies on visualization   Skin closure:  None   Dressing:  Open (no dressing)   Procedure completion:  Tolerated  (including critical care time)  Medications Ordered in UC Medications - No data to display  Initial Impression / Assessment and Plan / UC Course  I have reviewed the triage vital signs and the nursing notes.  Pertinent labs & imaging results that were available during my care of the patient were reviewed by me and considered in my medical decision making (see chart for details).  Vitals and triage reviewed, patient is hemodynamically stable.  Embedded earring to right earlobe, back attached.  Patient reports she is having her procedure tomorrow and needs all jewelry removed, attempted at home.  Area cleaned, numbed and plastic back of earring manipulated with hemostats, eventually able to remove back and entire earring.  Earring given back to patient.  Plastic back disposed of.  Covered with Keflex, normal renal function.  Wound care discussed.  Plan of care, follow-up care and return precautions reviewed, no questions at this time.    Final Clinical Impressions(s) / UC Diagnoses   Final diagnoses:  Embedded earring of right ear, initial encounter     Discharge Instructions      Today we removed an embedded earring from your right ear lobe. Please keep the area clean and dry.  You can apply topical antibacterial ointment.  I am covering you with oral antibiotics to prevent against infection.  Please take all antibiotics as prescribed and until finished.    Please return to clinic if you develop any new or concerning signs of infection.    ED Prescriptions     Medication Sig Dispense Auth. Provider   cephALEXin (KEFLEX) 500 MG capsule  Take 1 capsule (500 mg total) by mouth 2 (two) times daily for 5 days. 10 capsule , [place] N, FNP      PDMP not reviewed this encounter.   , [place] N, [place] 09/18/22 [phone]

## 2022-09-18 NOTE — ED Triage Notes (Signed)
Pt states she has to remove her earrings for surgery on Tuesday and they are stock on her ear lobe, redness, swollen noticed on ear lobe.

## 2022-09-28 ENCOUNTER — Ambulatory Visit: Payer: Medicare HMO | Admitting: Neurology

## 2022-10-05 ENCOUNTER — Ambulatory Visit: Payer: Medicare HMO | Admitting: Adult Health

## 2022-11-17 ENCOUNTER — Encounter: Payer: Self-pay | Admitting: Adult Health

## 2022-11-17 ENCOUNTER — Ambulatory Visit: Payer: Medicare HMO | Admitting: Adult Health

## 2022-11-17 VITALS — BP 130/64 | HR 65 | Temp 98.4°F | Ht 65.0 in | Wt 189.6 lb

## 2022-11-17 DIAGNOSIS — G4733 Obstructive sleep apnea (adult) (pediatric): Secondary | ICD-10-CM | POA: Diagnosis not present

## 2022-11-17 DIAGNOSIS — J4530 Mild persistent asthma, uncomplicated: Secondary | ICD-10-CM | POA: Diagnosis not present

## 2022-11-17 MED ORDER — TRELEGY ELLIPTA 100-62.5-25 MCG/ACT IN AEPB: 1.0000 | INHALATION_SPRAY | Freq: Every day | RESPIRATORY_TRACT | 6 refills | Status: AC

## 2022-11-17 MED ORDER — TRELEGY ELLIPTA 100-62.5-25 MCG/ACT IN AEPB: 1.0000 | INHALATION_SPRAY | Freq: Every day | RESPIRATORY_TRACT | Status: AC

## 2022-11-17 NOTE — Addendum Note (Signed)
Addended by: Delrae Rend on: 11/17/2022 04:47 PM   Modules accepted: Orders

## 2022-11-17 NOTE — Assessment & Plan Note (Signed)
Excellent control and compliance on CPAP  Plan  Patient Instructions  Continue on CPAP At bedtime, wear all night long .  Order for new supplies -[employer].  Work on healthy weight loss.   Stop Wixela .  Restart Trelegy 1 puff daily , rinse after use .  Albuterol inhaler As needed   Continue on Singulair daily .   Follow up with Dr. Vassie Loll  in 3-4 months and As needed

## 2022-11-17 NOTE — Patient Instructions (Addendum)
Continue on CPAP At bedtime, wear all night long .  Order for new supplies -[employer].  Work on healthy weight loss.   Stop Wixela .  Restart Trelegy 1 puff daily , rinse after use .  Albuterol inhaler As needed   Continue on Singulair daily .   Follow up with Dr. Vassie Loll  in 3-4 months and As needed

## 2022-11-17 NOTE — Progress Notes (Signed)
@Patient  ID: Kayla Solomon, female    DOB: [birth year], 72 y.o.   MRN: [id number]  Chief Complaint  Patient presents with   Follow-up    Referring provider: Loura Back, NP  HPI: 72 year old female seen for sleep consult January 14, 2021 for sleep apnea Medical history significant for asthma followed by [facility]   TEST/EVENTS :  PFT 07/12/2022 ratio 100% FEV1 111% FVC 111% DLCO 51% (care everywhere)   11/17/2022 Follow up ; OSA  Patient returns for a follow up for sleep apnea. Last seen 12/2020. She is on CPAP . Says she wears it every night. Feels she benefits from CPAP . Needs supplies her headgear is broken. Uses [facility]. CPAP download shows excellent  compliance with daily average usage at 8hr . AHI 1.0.  Uses nasal pillows. On AutoCPAP 5-15.   Previously on Trelegy while living in [place]. Now on Wixela . Does not feel it is working as well. Feels the hot temps are hard on her asthma.  Never smoker . CT chest showed 08/2022 showed stable lung nodules. PCP is following with repeat CT chest 08/2023.  Has intermittent wheezing and cough . No fever or discolored mucus .  Remains on singulair daily . No increased albuterol inhaler.     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   Hiatal hernia 04/07/2022   Hypercholesterolemia 01/23/2018   Hyperlipidemia, unspecified 03/30/2018   Hypertension, essential 02/12/2018   Hypothyroidism    Insomnia 04/07/2022   Iron deficiency anemia 04/07/2022   Irritable bowel syndrome 01/23/2018   Mild cognitive impairment of uncertain or unknown etiology 05/16/2022   Mild persistent asthma 04/10/2018   Obstructive sleep apnea 01/23/2018   Osteopenia 04/07/2022   Osteoporosis 01/23/2018   Peripheral vascular disease 04/07/2022   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   Polyp of nasal cavity 04/10/2018   Recurrent falls 04/07/2022   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     Ascorbic Acid (VITAMIN C) 1000 MG tablet Take 1,000 mg by mouth daily.     atorvastatin (LIPITOR) 20 MG tablet Take 20 mg by mouth daily.     Biotin 16109 MCG TABS Take 1 capsule by mouth daily.     co-enzyme Q-10 30 MG  capsule Take 30 mg by mouth 2 (two) times daily.     Cobalamin Combinations (B-12) [phone] MCG SUBL Take 5,000 mcg by mouth daily. 2 daily     desmopressin (DDAVP) 0.1 MG tablet Take 0.1 mg by mouth 2 (two) times daily.     ferrous sulfate 325 (65 FE) MG tablet Take 325 mg by mouth daily with breakfast.     FLUoxetine (PROZAC) 40 MG capsule Take 40 mg by mouth daily.     fluticasone (FLONASE) 50 MCG/ACT nasal spray USE 1 SPRAY NASALLY TWICE  DAILY     levothyroxine (SYNTHROID) 112 MCG tablet Take 112 mcg by mouth daily before breakfast.     meclizine (ANTIVERT) 12.5 MG tablet Take 12.5 mg by mouth 3 (three) times daily.     montelukast (SINGULAIR) 10 MG tablet Take 10 mg by mouth at bedtime.     nitroGLYCERIN (NITROSTAT) 0.4 MG SL tablet Place 1 tablet (0.4 mg total) under the tongue every 5 (five) minutes as needed for chest pain. 25 tablet 12   pantoprazole (PROTONIX) 40 MG tablet Take 40 mg by mouth daily.     tizanidine (ZANAFLEX) 2 MG capsule Take 2 mg by mouth daily.     traZODone (DESYREL) 100 MG tablet Take 100 mg by mouth at bedtime.     vitamin E 180 MG (400 UNITS) capsule Take 400 Units by mouth daily. 450 mg (1,000 unit) capsule     TRELEGY ELLIPTA 100-62.5-25 MCG/ACT AEPB Inhale 1 puff into the lungs daily. (Patient not taking: Reported on 11/17/2022)     No facility-administered medications prior to visit.     Review of Systems:   Constitutional:   No  weight loss, night sweats,  Fevers, chills,  +fatigue, or  lassitude.  HEENT:   No headaches,  Difficulty swallowing,  Tooth/dental problems, or  Sore throat,                No sneezing, itching, ear ache,  +nasal congestion, post nasal drip,   CV:  No chest pain,  Orthopnea, PND, swelling in lower extremities, anasarca, dizziness, palpitations, syncope.   GI  No heartburn, indigestion, abdominal pain, nausea, vomiting, diarrhea, change in bowel habits, loss of appetite, bloody stools.   Resp:   No chest wall  deformity  Skin: no rash or lesions.  GU: no dysuria, change in color of urine, no urgency or frequency.  No flank pain, no hematuria   MS:  No joint pain or swelling.  No decreased range of motion.  No back pain.    Physical Exam  BP 130/64 (BP Location: Left Arm, Patient Position: Sitting, Cuff Size: Normal)   Pulse 65   Temp 98.4 F (36.9 C) (Oral)   Ht 5\' 5"  (1.651 m)   Wt 189 lb 9.6 oz (86 kg)   SpO2 96%   BMI 31.55 kg/m   GEN: A/Ox3; pleasant ,  NAD, well nourished    HEENT:  [place]/AT,  NOSE-clear, THROAT-clear, no lesions, no postnasal drip or exudate noted.   NECK:  Supple w/ fair ROM; no JVD; normal carotid impulses w/o bruits; no thyromegaly or nodules palpated; no lymphadenopathy.    RESP  Clear  P & A; w/o, wheezes/ rales/ or rhonchi. no accessory muscle use, no dullness to percussion  CARD:  RRR, no m/r/g, no peripheral edema, pulses intact, no cyanosis or clubbing.  GI:   Soft & nt; nml bowel sounds; no organomegaly or masses detected.   Musco: Warm bil, no deformities or joint swelling noted.   Neuro: alert, no focal deficits noted.    Skin: Warm, no lesions or rashes    Lab Results:  CBC    Component Value Date/Time   WBC 5.0 11/27/2020 1215   RBC 4.33 11/27/2020 1215   HGB 12.2 11/27/2020 1215   HCT 37.7 11/27/2020 1215   PLT 367.0 11/27/2020 1215   MCV 87.0 11/27/2020 1215   MCHC 32.5 11/27/2020 1215   RDW 15.7 (H) 11/27/2020 1215   LYMPHSABS 1.0 11/27/2020 1215   MONOABS 0.5 11/27/2020 1215   EOSABS 0.0 11/27/2020 1215   BASOSABS 0.0 11/27/2020 1215    BMET    Component Value Date/Time   NA 133 (L) 11/27/2020 1215   K 4.4 11/27/2020 1215   CL 98 11/27/2020 1215   CO2 27 11/27/2020 1215   GLUCOSE 81 11/27/2020 1215   BUN 9 11/27/2020 1215   CREATININE 0.77 11/27/2020 1215   CALCIUM 9.4 11/27/2020 1215    BNP No results found for: "BNP"  ProBNP    Component Value Date/Time   PROBNP 32.0 11/27/2020 1215    Imaging: No  results found.  Administration History     None           No data to display          No results found for: "NITRICOXIDE"      Assessment & Plan:   Obstructive sleep apnea Excellent control and compliance on CPAP  Plan  Patient Instructions  Continue on CPAP At bedtime, wear all night long .  Order for new supplies -[employer].  Work on healthy weight loss.   Stop Wixela .  Restart Trelegy 1 puff daily , rinse after use .  Albuterol inhaler As needed   Continue on Singulair daily .   Follow up with Dr. Vassie Loll  in 3-4 months and As needed          Rubye Oaks, NP 11/17/2022

## 2022-11-17 NOTE — Addendum Note (Signed)
Addended by: Delrae Rend on: 11/17/2022 05:17 PM   Modules accepted: Orders

## 2022-11-17 NOTE — Assessment & Plan Note (Signed)
Increased symptom burden with hot temps. Change back to Trelegy .  Continue on trigger prevention   Plan  Patient Instructions  Continue on CPAP At bedtime, wear all night long .  Order for new supplies -[employer].  Work on healthy weight loss.   Stop Wixela .  Restart Trelegy 1 puff daily , rinse after use .  Albuterol inhaler As needed   Continue on Singulair daily .   Follow up with Dr. Vassie Loll  in 3-4 months and As needed

## 2022-11-21 ENCOUNTER — Telehealth: Payer: Self-pay

## 2022-11-21 NOTE — Telephone Encounter (Signed)
*  [facility]ulm  Pharmacy Patient Advocate Encounter   Received notification from CoverMyMeds that prior authorization for Trelegy Ellipta 100-62.5-25MCG/ACT aerosol powder  is required/requested.   Insurance verification completed.   The patient is insured through [employer] .   Per test claim: PA required; PA submitted to [facility] via CoverMyMeds Key/confirmation #/EOC [id number] Status is pending

## 2022-11-22 NOTE — Telephone Encounter (Signed)
PA has been APPROVED from 11/21/2022-04/24/2023

## 2022-12-04 ENCOUNTER — Emergency Department (HOSPITAL_COMMUNITY): Payer: Medicare HMO

## 2022-12-04 ENCOUNTER — Emergency Department (HOSPITAL_COMMUNITY)
Admission: EM | Admit: 2022-12-04 | Discharge: 2022-12-04 | Disposition: A | Discharge status: Home / Self Care | Payer: Medicare HMO | Attending: Student | Admitting: Student

## 2022-12-04 ENCOUNTER — Other Ambulatory Visit: Payer: Self-pay

## 2022-12-04 ENCOUNTER — Encounter (HOSPITAL_COMMUNITY): Payer: Self-pay

## 2022-12-04 DIAGNOSIS — I129 Hypertensive chronic kidney disease with stage 1 through stage 4 chronic kidney disease, or unspecified chronic kidney disease: Secondary | ICD-10-CM | POA: Diagnosis not present

## 2022-12-04 DIAGNOSIS — J449 Chronic obstructive pulmonary disease, unspecified: Secondary | ICD-10-CM | POA: Diagnosis not present

## 2022-12-04 DIAGNOSIS — Z7951 Long term (current) use of inhaled steroids: Secondary | ICD-10-CM | POA: Insufficient documentation

## 2022-12-04 DIAGNOSIS — U071 COVID-19: Secondary | ICD-10-CM | POA: Insufficient documentation

## 2022-12-04 DIAGNOSIS — N189 Chronic kidney disease, unspecified: Secondary | ICD-10-CM | POA: Diagnosis not present

## 2022-12-04 DIAGNOSIS — E039 Hypothyroidism, unspecified: Secondary | ICD-10-CM | POA: Insufficient documentation

## 2022-12-04 DIAGNOSIS — Z79899 Other long term (current) drug therapy: Secondary | ICD-10-CM | POA: Diagnosis not present

## 2022-12-04 DIAGNOSIS — R0602 Shortness of breath: Secondary | ICD-10-CM | POA: Diagnosis not present

## 2022-12-04 DIAGNOSIS — Z859 Personal history of malignant neoplasm, unspecified: Secondary | ICD-10-CM | POA: Insufficient documentation

## 2022-12-04 DIAGNOSIS — J440 Chronic obstructive pulmonary disease with acute lower respiratory infection: Secondary | ICD-10-CM

## 2022-12-04 DIAGNOSIS — E871 Hypo-osmolality and hyponatremia: Secondary | ICD-10-CM | POA: Diagnosis not present

## 2022-12-04 DIAGNOSIS — J069 Acute upper respiratory infection, unspecified: Secondary | ICD-10-CM | POA: Insufficient documentation

## 2022-12-04 LAB — BASIC METABOLIC PANEL
Anion gap: 13 (ref 5–15)
BUN: 5 mg/dL — ABNORMAL LOW (ref 8–23)
CO2: 22 mmol/L (ref 22–32)
Calcium: 8.8 mg/dL — ABNORMAL LOW (ref 8.9–10.3)
Chloride: 90 mmol/L — ABNORMAL LOW (ref 98–111)
Creatinine, Ser: 0.78 mg/dL (ref 0.44–1.00)
GFR, Estimated: >60 mL/min (ref >60)
Glucose, Bld: 133 mg/dL — ABNORMAL HIGH (ref 70–99)
Potassium: 3.3 mmol/L — ABNORMAL LOW (ref 3.5–5.1)
Sodium: 125 mmol/L — ABNORMAL LOW (ref 135–145)

## 2022-12-04 LAB — CBC
HCT: 41.9 % (ref 36.0–46.0)
Hemoglobin: 14 g/dL (ref 12.0–15.0)
MCH: 28.9 pg (ref 26.0–34.0)
MCHC: 33.4 g/dL (ref 30.0–36.0)
MCV: 86.4 fL (ref 80.0–100.0)
Platelets: 236 10*3/uL (ref 150–400)
RBC: 4.85 MIL/uL (ref 3.87–5.11)
RDW: 12.2 % (ref 11.5–15.5)
WBC: 6.1 10*3/uL (ref 4.0–10.5)
nRBC: 0 % (ref 0.0–0.2)

## 2022-12-04 LAB — TROPONIN I (HIGH SENSITIVITY)
Troponin I (High Sensitivity): 5 ng/L (ref <18)
Troponin I (High Sensitivity): 5 ng/L (ref <18)

## 2022-12-04 MED ORDER — POTASSIUM CHLORIDE CRYS ER 20 MEQ PO TBCR
20.0000 meq | EXTENDED_RELEASE_TABLET | Freq: Once | ORAL | Status: AC
  Administered 2022-12-04: 20 meq via ORAL
  Filled 2022-12-04: qty 1

## 2022-12-04 MED ORDER — IPRATROPIUM-ALBUTEROL 0.5-2.5 (3) MG/3ML IN SOLN
3.0000 mL | Freq: Once | RESPIRATORY_TRACT | Status: AC
  Administered 2022-12-04: 3 mL via RESPIRATORY_TRACT
  Filled 2022-12-04: qty 3

## 2022-12-04 NOTE — ED Notes (Signed)
Pt to xray, well appearing upon transport.  

## 2022-12-04 NOTE — ED Notes (Signed)
EM provider at bedside.

## 2022-12-04 NOTE — Discharge Instructions (Signed)
It was a pleasure caring for you today.  As discussed you will need to follow-up with your endocrinologist in the next couple of days to possibly adjust your desmopressin levels given that your sodium was low at 125.  You also need to take your COPD medicines as prescribed.  Seek emergency care if experiencing any new or worsening symptoms.

## 2022-12-04 NOTE — ED Triage Notes (Signed)
Pt states she tested positive for COVID on Friday. Pt c/o N/V, productive cough w/white mucous, heart pounding, sweating.

## 2022-12-04 NOTE — ED Provider Notes (Signed)
[place] EMERGENCY DEPARTMENT AT [facility] Provider Note   CSN: [id number] Arrival date & time: 12/04/22  0757     History  Chief Complaint  Patient presents with   Covid Positive    Kayla Solomon is a 72 y.o. female PMHx CHF, polymyalgia rheumatica, diabetes insipidus, HTN, HLD, CKD, COPD, GERD, Hypothyroidism who presents to ED concerned with nausea, non bloody vomiting (x1), productive cough with white sputum, palpitations, and sweating that has been progressing over the past 8 days. Patient seen by PCP 2 days ago and was prescribed Paxlovid but not able to obtain prescription d/t pharmacy not being able to read provider's handwriting. Patient also complaining of increased DOE over the past week. Patient not currently taking her home Trelegy for COPD because her last prescription has been "infected with COVID".  Denies calf tenderness, recent surgery/immobilization, hx DVT/PE, hemoptysis, hx cancer.    HPI     Home Medications Prior to Admission medications   Medication Sig Start Date End Date Taking? Authorizing Provider  albuterol (VENTOLIN HFA) 108 (90 Base) MCG/ACT inhaler INHALE 1 PUFF BY MOUTH THREE TIMES DAILY AS NEEDED FOR 30 DAYS    [provider]  ALPRAZolam Prudy Feeler) 1 MG tablet     [provider]  amLODipine (NORVASC) 10 MG tablet Take 10 mg by mouth daily. 12/16/21   [provider]  Ascorbic Acid (VITAMIN C) 1000 MG tablet Take 1,000 mg by mouth daily.    [provider]  atorvastatin (LIPITOR) 20 MG tablet Take 20 mg by mouth daily.    [provider]  Biotin 74259 MCG TABS Take 1 capsule by mouth daily.    [provider]  co-enzyme Q-10 30 MG capsule Take 30 mg by mouth 2 (two) times daily.    [provider]  Cobalamin Combinations (B-12) [phone] MCG SUBL Take 5,000 mcg by mouth daily. 2 daily    [provider]  desmopressin (DDAVP) 0.1 MG tablet Take 0.1 mg by mouth 2 (two)  times daily.    [provider]  ferrous sulfate 325 (65 FE) MG tablet Take 325 mg by mouth daily with breakfast.    [provider]  FLUoxetine (PROZAC) 40 MG capsule Take 40 mg by mouth daily.    [provider]  fluticasone (FLONASE) 50 MCG/ACT nasal spray USE 1 SPRAY NASALLY TWICE  DAILY    [provider]  Fluticasone-Umeclidin-Vilant (TRELEGY ELLIPTA) 100-62.5-25 MCG/ACT AEPB Inhale 1 puff into the lungs daily. 11/17/22   Parrett, Virgel Bouquet, NP  levothyroxine (SYNTHROID) 112 MCG tablet Take 112 mcg by mouth daily before breakfast.    [provider]  meclizine (ANTIVERT) 12.5 MG tablet Take 12.5 mg by mouth 3 (three) times daily. 08/23/21   [provider]  montelukast (SINGULAIR) 10 MG tablet Take 10 mg by mouth at bedtime.    [provider]  nitroGLYCERIN (NITROSTAT) 0.4 MG SL tablet Place 1 tablet (0.4 mg total) under the tongue every 5 (five) minutes as needed for chest pain. 04/08/21   Sande Rives, MD  pantoprazole (PROTONIX) 40 MG tablet Take 40 mg by mouth daily.    [provider]  tizanidine (ZANAFLEX) 2 MG capsule Take 2 mg by mouth daily.    [provider]  traZODone (DESYREL) 100 MG tablet Take 100 mg by mouth at bedtime.    [provider]  TRELEGY ELLIPTA 100-62.5-25 MCG/ACT AEPB Inhale 1 puff into the lungs daily. 11/17/22   Parrett,  Tammy S, NP  vitamin E 180 MG (400 UNITS) capsule Take 400 Units by mouth daily. 450 mg (1,000 unit) capsule    [provider]      Allergies    Patient has no known allergies.    Review of Systems   Review of Systems  Respiratory:  Positive for cough.     Physical Exam Updated Vital Signs BP 135/72   Pulse 87   Temp 98.9 F (37.2 C) (Oral)   Resp 13   Ht 5\' 5"  (1.651 m)   Wt 86 kg   SpO2 97%   BMI 31.55 kg/m  Physical Exam Vitals and nursing note reviewed.  Constitutional:      General: She is not in acute distress.     Appearance: She is not ill-appearing or toxic-appearing.  HENT:     Head: Normocephalic and atraumatic.     Mouth/Throat:     Mouth: Mucous membranes are moist.  Eyes:     General: No scleral icterus.       Right eye: No discharge.        Left eye: No discharge.     Conjunctiva/sclera: Conjunctivae normal.  Cardiovascular:     Rate and Rhythm: Normal rate and regular rhythm.     Pulses: Normal pulses.     Heart sounds: Normal heart sounds. No murmur heard. Pulmonary:     Effort: Pulmonary effort is normal.     Comments: Rhonchi and upper airway sounds in lungs BL. Coughing multiple times during physical exam.  Abdominal:     General: Abdomen is flat. Bowel sounds are normal.     Palpations: Abdomen is soft. There is no mass.     Tenderness: There is no abdominal tenderness.  Musculoskeletal:     Right lower leg: No edema.     Left lower leg: No edema.  Skin:    General: Skin is warm and dry.     Findings: No rash.  Neurological:     General: No focal deficit present.     Mental Status: She is alert. Mental status is at baseline.  Psychiatric:        Mood and Affect: Mood normal.        Behavior: Behavior normal.     ED Results / Procedures / Treatments   Labs (all labs ordered are listed, but only abnormal results are displayed) Labs Reviewed  BASIC METABOLIC PANEL - Abnormal; Notable for the following components:      Result Value   Sodium 125 (*)    Potassium 3.3 (*)    Chloride 90 (*)    Glucose, Bld 133 (*)    BUN 5 (*)    Calcium 8.8 (*)    All other components within normal limits  CBC  NA AND K (SODIUM & POTASSIUM), RAND UR  TROPONIN I (HIGH SENSITIVITY)  TROPONIN I (HIGH SENSITIVITY)    EKG None  Radiology DG Chest 2 View  Result Date: 12/04/2022 CLINICAL DATA:  COVID positive 4 days EXAM: CHEST - 2 VIEW COMPARISON:  None Available. FINDINGS: Normal cardiac silhouette. No effusion, infiltrate, or pneumothorax. Hiatal hernia. LEFT shoulder  arthroplasty IMPRESSION: No acute cardiopulmonary process. Electronically Signed   By: Genevive Bi M.D.   On: 12/04/2022 09:54    Procedures Procedures    Medications Ordered in ED Medications  ipratropium-albuterol (DUONEB) 0.5-2.5 (3) MG/3ML nebulizer solution 3 mL (3 mLs Nebulization Given 12/04/22 0859)  potassium chloride SA (KLOR-CON M) CR tablet 20  mEq (20 mEq Oral Given 12/04/22 1138)    ED Course/ Medical Decision Making/ A&P                                 Medical Decision Making Amount and/or Complexity of Data Reviewed Labs: ordered. Radiology: ordered.  Risk Prescription drug management.   This patient presents to the ED for concern of shortness of breath, this involves an extensive number of treatment options, and is a complaint that carries with it a high risk of complications and morbidity.  The differential diagnosis includes Anxiety, Anaphylaxis/Angioedema, Aspirated FB, Arrhythmia, CHF, Asthma, COPD, PNA, COVID/Flu/RSV, STEMI, Tamponade, TPNX, Sepsis   Co morbidities that complicate the patient evaluation  CHF, polymyalgia rheumatica, diabetes insipidus, HTN, HLD, CKD, COPD, GERD, Hypothyroidism   Lab Tests:  I Ordered, and personally interpreted labs.  The pertinent results include:   - BMP: hyponatremia (125), mild hypokalemia (3.3) - Trop: initial and repeat within normal limits - CBC: No concern for anemia or leukocytosis   Imaging Studies ordered:  I ordered imaging studies including  -Chest xray: to assess for process contributing to patient's symptoms I independently visualized and interpreted imaging I agree with the radiologist interpretation    Problem List / ED Course / Critical interventions / Medication management  Patient presents to the emergency room concerned for SOB/DOE, nausea, palpitations, and sweating has been progressively worsening over the past 8 days.  Patient diagnosed with COVID 2 days ago but was not able to pick  up prescription for Paxlovid due to miscommunication with pharmacy. Patient stating that she has not been taking her Trelegy or CPAP d/t her equipment being contaminated by COVID.  Physical exam with mild rhonchi and upper airway sounds that cleared with coughing. These lung sounds resolved with one round of Duoneb. Patient is afebrile with stable vitals. Patient ambulatory to bathroom in ED after breathing treatment without any complaints of DOE or SOB. Chest xray without acute cardiopulmonary disease. EKG with mild ST changes - troponin negative.  CBC without leukocytosis or anemia.  Delta troponin negative.  BMP with hyponatremia at 125-patient stating that she takes desmopressin for her diabetes insipidus and is currently denying any concerning signs for hyponatremia such as confusion, weakness, seizures, nausea.  Patient also with mild hypokalemia at 3.3 which I provided her oral potassium supplement for.  Patient stating that she feels better after the 1 dose of DuoNeb and would like to go home.  Attempted to obtain urine sodium/potassium levels but patient refused. Shared with patient warning signs of hyponatremia and educated patient that she must follow-up with her endocrinologist in the next couple of days to possibly adjust her dose of desmopressin.  Patient verbalized understanding of plan. Also provided patient with return precautions.  It seems that patient's symptoms are caused by COVID along with medical noncompliance to COPD medications. Educated patient that she will need to continue taking COPD medications as prescribed. Patient asked for Paxlovid but does not meet criteria for this medication as symptoms started 8 days ago.  I have reviewed the patients home medicines and have made adjustments as needed Patient was given return precautions. Patient stable for discharge at this time. Patient verbalized understanding of plan.  DDx: These are considered less likely due to history of present  illness and physical exam findings Aspirated FB: no history of choking Arrhythmia/STEMI: EKG reassuring; troponin negative Tamponade: chest xray without concern CHF: no physical  exam findings TPNX: Lungs clear to auscultation bilaterally Sepsis: afebrile and other vital signs stable  Risk Stratification Score:  Wells: 0 SIRS: 1 (d/t HR of 93)   Social Determinants of Health:  none           Final Clinical Impression(s) / ED Diagnoses Final diagnoses:  Chronic obstructive pulmonary disease with acute lower respiratory infection (HCC)  COVID  SOB (shortness of breath) on exertion  Hyponatremia    Rx / DC Orders ED Discharge Orders     None         Margarita Rana 12/04/22 1229    Glendora Score, MD 12/04/22 1814

## 2022-12-04 NOTE — ED Notes (Signed)
This RN reviewed discharge instructions with patient and family. Both verbalized understanding and denied any further questions. PT well appearing upon discharge and reports tolerable pain. Pt ambulated with stable gait to exit. Pt endorses ride home.

## 2022-12-04 NOTE — ED Notes (Signed)
PT up to bathroom with stable gait.  

## 2022-12-04 NOTE — ED Notes (Signed)
PT removed all monitoring equipment and requests to not keep on.

## 2022-12-04 NOTE — ED Notes (Signed)
Pt reports improved breathing

## 2022-12-04 NOTE — ED Notes (Addendum)
EM provider at bedside.

## 2022-12-12 NOTE — Progress Notes (Deleted)
Cardiology Clinic Note   Patient Name: Kayla Solomon Date of Encounter: 12/12/2022  Primary Care Provider:  Loura Back, NP Primary Cardiologist:  None  Patient Profile    72 year old female with history of COPD, type 2 diabetes, dyslipidemia, hypertension, hypothyroidism.  Repeat nuclear medicine stress test which was negative for ischemia, CTA with a calcium score of 30.  Most recently seen by Dr. Rennis Golden on 12/28/2021 to be established as her husband is also followed by Dr. Rennis Golden.  At that time the patient was having significant fatigue and there was concern about dysarthria as well as TIAs.  She was referred to Dr. Arbutus Leas neurology.  Repeat echocardiogram was planned.  Echocardiogram on 12/29/2021 revealed normal LVEF of 60 to 65% with grade 1 diastolic dysfunction.  No valvular abnormalities were seen.  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   Hiatal hernia 04/07/2022   Hypercholesterolemia  01/23/2018   Hyperlipidemia, unspecified 03/30/2018   Hypertension, essential 02/12/2018   Hypothyroidism    Insomnia 04/07/2022   Iron deficiency anemia 04/07/2022   Irritable bowel syndrome 01/23/2018   Mild cognitive impairment of uncertain or unknown etiology 05/16/2022   Mild persistent asthma 04/10/2018   Obstructive sleep apnea 01/23/2018   Osteopenia 04/07/2022   Osteoporosis 01/23/2018   Peripheral vascular disease 04/07/2022   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   Polyp of nasal cavity 04/10/2018   Recurrent falls 04/07/2022   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  With history as described above.  She was recently seen in the emergency room on 12/04/2022 for evaluation of cough and palpitations and was diagnosed with COVID-19.  She was also found to be hyponatremic 125, she was given duo nebulizer treatments was to follow-up with primary care and endocrinology.     Home Medications    Current Outpatient Medications  Medication Sig Dispense Refill   albuterol (VENTOLIN HFA) 108 (90 Base) MCG/ACT inhaler INHALE 1 PUFF BY MOUTH THREE TIMES DAILY AS NEEDED FOR 30 DAYS  ALPRAZolam (XANAX) 1 MG tablet      amLODipine (NORVASC) 10 MG tablet Take 10 mg by mouth daily.     Ascorbic Acid (VITAMIN C) 1000 MG tablet Take 1,000 mg by mouth daily.     atorvastatin (LIPITOR) 20 MG tablet Take 20 mg by mouth daily.     Biotin 16109 MCG TABS Take 1 capsule by mouth daily.     co-enzyme Q-10 30 MG capsule Take 30 mg by mouth 2 (two) times daily.     Cobalamin Combinations (B-12) [phone] MCG SUBL Take 5,000 mcg by mouth daily. 2  daily     desmopressin (DDAVP) 0.1 MG tablet Take 0.1 mg by mouth 2 (two) times daily.     ferrous sulfate 325 (65 FE) MG tablet Take 325 mg by mouth daily with breakfast.     FLUoxetine (PROZAC) 40 MG capsule Take 40 mg by mouth daily.     fluticasone (FLONASE) 50 MCG/ACT nasal spray USE 1 SPRAY NASALLY TWICE  DAILY     Fluticasone-Umeclidin-Vilant (TRELEGY ELLIPTA) 100-62.5-25 MCG/ACT AEPB Inhale 1 puff into the lungs daily.     levothyroxine (SYNTHROID) 112 MCG tablet Take 112 mcg by mouth daily before breakfast.     meclizine (ANTIVERT) 12.5 MG tablet Take 12.5 mg by mouth 3 (three) times daily.     montelukast (SINGULAIR) 10 MG tablet Take 10 mg by mouth at bedtime.     nitroGLYCERIN (NITROSTAT) 0.4 MG SL tablet Place 1 tablet (0.4 mg total) under the tongue every 5 (five) minutes as needed for chest pain. 25 tablet 12   pantoprazole (PROTONIX) 40 MG tablet Take 40 mg by mouth daily.     tizanidine (ZANAFLEX) 2 MG capsule Take 2 mg by mouth daily.     traZODone (DESYREL) 100 MG tablet Take 100 mg by mouth at bedtime.     TRELEGY ELLIPTA 100-62.5-25 MCG/ACT AEPB Inhale 1 puff into the lungs daily. 60 each 6   vitamin E 180 MG (400 UNITS) capsule Take 400 Units by mouth daily. 450 mg (1,000 unit) capsule     No current facility-administered medications for this visit.     Family History    Family History  Problem Relation Age of Onset   Heart attack Father 29   Epilepsy Sister        skull fx in early childhood   Heart attack Maternal Aunt    Stroke Maternal Uncle    She indicated that her father is deceased. She indicated that the status of her sister is unknown. She indicated that the status of her maternal aunt is unknown. She indicated that the status of her maternal uncle is unknown.  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[employer]; Runner, broadcasting/film/video  Tobacco Use   Smoking status: Never   Smokeless tobacco: Never  Vaping Use   Vaping status: Never Used  Substance and Sexual Activity   Alcohol use: Not Currently   Drug use: Never   Sexual activity: Not Currently  Other Topics Concern   Not on file  Social History Narrative   Left Handed   Caff 2 cups   One story home with daughter and husband  retired   International aid/development worker of [employer] Strain: Not on [employer] Insecurity: Not on file  Transportation Needs: Not on file  Physical Activity: Not on file  Stress: Not on file  Social Connections: Unknown (09/01/2021)   [facility]   Social Network    Social Network: Not on file  Intimate Partner Violence: Unknown (07/30/2021)   Received from [facility]   HITS    Physically Hurt: Not on file    Insult or Talk Down To: Not on file    Threaten Physical Harm: Not on file    Scream or Curse: Not on file     Review of Systems    General:  No chills, fever, night sweats or weight changes.  Cardiovascular:  No chest pain, dyspnea on exertion, edema, orthopnea, palpitations, paroxysmal nocturnal dyspnea. Dermatological: No rash, lesions/masses Respiratory: No cough, dyspnea Urologic: No hematuria, dysuria Abdominal:   No nausea, vomiting, diarrhea, bright red blood per rectum, melena, or hematemesis Neurologic:  No visual changes, wkns, changes in mental status. All other systems reviewed and are otherwise negative except as noted above.       Physical Exam    VS:  There were no vitals taken for this visit. , BMI There is no height or weight on file to calculate BMI.     GEN: Well nourished, well developed, in no acute distress. HEENT: normal. Neck: Supple, no JVD, carotid bruits, or masses. Cardiac: RRR, no murmurs, rubs, or gallops. No clubbing, cyanosis, edema.   Radials/DP/PT 2+ and equal bilaterally.  Respiratory:  Respirations regular and unlabored, clear to auscultation bilaterally. GI: Soft, nontender, nondistended, BS + x 4. MS: no deformity or atrophy. Skin: warm and dry, no rash. Neuro:  Strength and sensation are intact. Psych: Normal affect.      Lab Results  Component Value Date   WBC 6.1 12/04/2022   HGB 14.0 12/04/2022   HCT 41.9 12/04/2022   MCV 86.4 12/04/2022   PLT 236 12/04/2022   Lab Results  Component Value Date   CREATININE 0.78 12/04/2022   BUN 5 (L) 12/04/2022   NA 125 (L) 12/04/2022   K 3.3 (L) 12/04/2022   CL 90 (L) 12/04/2022   CO2 22 12/04/2022   No results found for: "ALT", "AST", "GGT", "ALKPHOS", "BILITOT" No results found for: "CHOL", "HDL", "LDLCALC", "LDLDIRECT", "TRIG", "CHOLHDL"  No results found for: "HGBA1C"   Review of Prior Studies    Echocardiogram 12/29/2021  1. Left ventricular ejection fraction, by estimation, is 60 to 65%. Left  ventricular ejection fraction by 3D volume is 65 %. The left ventricle has  normal function. The left ventricle has no regional wall motion  abnormalities. There is mild left  ventricular hypertrophy. Left ventricular diastolic parameters are  consistent with Grade I diastolic dysfunction (impaired relaxation).   2. Right ventricular systolic function is normal. The right ventricular  size is normal. There is normal pulmonary artery systolic pressure. The  estimated right ventricular systolic pressure is 35.7 mmHg.   3. Left atrial size was moderately dilated.   4. The mitral valve is grossly normal. Trivial mitral valve  regurgitation.   5. The aortic valve is tricuspid. Aortic valve regurgitation is not  visualized.   6. The inferior vena cava is normal in size with greater than 50%  respiratory variability, suggesting right atrial pressure of 3 mmHg.    Assessment & Plan   1.  ***     {  Are you ordering a CV Procedure (e.g. stress test, cath, DCCV,  TEE, etc)?   Press F2        :[id number]}   Signed, Bettey Mare. Liborio Nixon, ANP, AACC   12/12/2022 9:47 AM      Office [phone] Fax [phone]  Notice: This dictation was prepared with Dragon dictation along with smaller phrase technology. Any transcriptional errors that result from this process are unintentional and may not be corrected upon review.

## 2022-12-15 ENCOUNTER — Ambulatory Visit: Payer: Medicare HMO | Admitting: Adult Health

## 2022-12-16 ENCOUNTER — Other Ambulatory Visit: Payer: Self-pay

## 2022-12-16 ENCOUNTER — Encounter (HOSPITAL_COMMUNITY): Payer: Self-pay | Admitting: *Deleted

## 2022-12-16 ENCOUNTER — Emergency Department (HOSPITAL_COMMUNITY)
Admission: EM | Admit: 2022-12-16 | Discharge: 2022-12-16 | Disposition: A | Discharge status: Home / Self Care | Payer: Medicare HMO | Attending: Emergency Medicine | Admitting: Emergency Medicine

## 2022-12-16 DIAGNOSIS — E871 Hypo-osmolality and hyponatremia: Secondary | ICD-10-CM | POA: Diagnosis not present

## 2022-12-16 DIAGNOSIS — E119 Type 2 diabetes mellitus without complications: Secondary | ICD-10-CM | POA: Diagnosis not present

## 2022-12-16 DIAGNOSIS — Z7951 Long term (current) use of inhaled steroids: Secondary | ICD-10-CM | POA: Diagnosis not present

## 2022-12-16 DIAGNOSIS — I509 Heart failure, unspecified: Secondary | ICD-10-CM | POA: Diagnosis not present

## 2022-12-16 DIAGNOSIS — J449 Chronic obstructive pulmonary disease, unspecified: Secondary | ICD-10-CM | POA: Diagnosis not present

## 2022-12-16 DIAGNOSIS — R059 Cough, unspecified: Secondary | ICD-10-CM | POA: Diagnosis present

## 2022-12-16 LAB — CBC WITH DIFFERENTIAL/PLATELET
Abs Immature Granulocytes: 0.07 10*3/uL (ref 0.00–0.07)
Basophils Absolute: 0 10*3/uL (ref 0.0–0.1)
Basophils Relative: 0 %
Eosinophils Absolute: 0.1 10*3/uL (ref 0.0–0.5)
Eosinophils Relative: 2 %
HCT: 35.4 % — ABNORMAL LOW (ref 36.0–46.0)
Hemoglobin: 11.7 g/dL — ABNORMAL LOW (ref 12.0–15.0)
Immature Granulocytes: 1 %
Lymphocytes Relative: 15 %
Lymphs Abs: 1.2 10*3/uL (ref 0.7–4.0)
MCH: 30.5 pg (ref 26.0–34.0)
MCHC: 33.1 g/dL (ref 30.0–36.0)
MCV: 92.2 fL (ref 80.0–100.0)
Monocytes Absolute: 0.8 10*3/uL (ref 0.1–1.0)
Monocytes Relative: 10 %
Neutro Abs: 5.6 10*3/uL (ref 1.7–7.7)
Neutrophils Relative %: 72 %
Platelets: 274 10*3/uL (ref 150–400)
RBC: 3.84 MIL/uL — ABNORMAL LOW (ref 3.87–5.11)
RDW: 12.7 % (ref 11.5–15.5)
WBC: 7.8 10*3/uL (ref 4.0–10.5)
nRBC: 0 % (ref 0.0–0.2)

## 2022-12-16 LAB — BASIC METABOLIC PANEL
Anion gap: 8 (ref 5–15)
BUN: 5 mg/dL — ABNORMAL LOW (ref 8–23)
CO2: 26 mmol/L (ref 22–32)
Calcium: 8 mg/dL — ABNORMAL LOW (ref 8.9–10.3)
Chloride: 101 mmol/L (ref 98–111)
Creatinine, Ser: 0.81 mg/dL (ref 0.44–1.00)
GFR, Estimated: >60 mL/min (ref >60)
Glucose, Bld: 102 mg/dL — ABNORMAL HIGH (ref 70–99)
Potassium: 3.8 mmol/L (ref 3.5–5.1)
Sodium: 135 mmol/L (ref 135–145)

## 2022-12-16 NOTE — ED Triage Notes (Signed)
Patient states she was here several weeks ago and was dx. With covid.  And has had a lingering cough, states her na level was low at that time and her PCP wanted her seen for low na. Denies chest pain

## 2022-12-16 NOTE — Discharge Instructions (Signed)
Your sodium level today was normal.  Please follow-up with your family doctor and endocrinologist in the office.  Please return for worsening difficulty breathing, or confusion.

## 2022-12-16 NOTE — ED Provider Notes (Signed)
[facility] EMERGENCY DEPARTMENT AT [facility] Provider Note   CSN: [id number] Arrival date & time: 12/16/22  1322     History  Chief Complaint  Patient presents with   Labs Only    Kayla Solomon is a 72 y.o. female.  72 yo F with a chief complaints of cough congestion going on for about 3 weeks now.  She was diagnosed with COVID at the onset of the symptoms.  She tells me that she had a virtual visit with her endocrinologist today who encouraged her to come to the emergency department to have her sodium level rechecked.  She tells me that she came to the emergency department and had her sodium checked and it was quite low and the doctor at the time wanted to admit her but she had elected to try and go home instead.  She continues to have a cough but does not feel like it is worsening.  Has a history of COPD CHF diabetes insipidus.        Home Medications Prior to Admission medications   Medication Sig Start Date End Date Taking? Authorizing Provider  albuterol (VENTOLIN HFA) 108 (90 Base) MCG/ACT inhaler INHALE 1 PUFF BY MOUTH THREE TIMES DAILY AS NEEDED FOR 30 DAYS    [provider]  ALPRAZolam Prudy Feeler) 1 MG tablet     [provider]  amLODipine (NORVASC) 10 MG tablet Take 10 mg by mouth daily. 12/16/21   [provider]  Ascorbic Acid (VITAMIN C) 1000 MG tablet Take 1,000 mg by mouth daily.    [provider]  atorvastatin (LIPITOR) 20 MG tablet Take 20 mg by mouth daily.    [provider]  Biotin 03474 MCG TABS Take 1 capsule by mouth daily.    [provider]  co-enzyme Q-10 30 MG capsule Take 30 mg by mouth 2 (two) times daily.    [provider]  Cobalamin Combinations (B-12) [phone] MCG SUBL Take 5,000 mcg by mouth daily. 2 daily    [provider]  desmopressin (DDAVP) 0.1 MG tablet Take 0.1 mg by mouth 2 (two) times daily.    [provider]  ferrous sulfate 325 (65 FE) MG tablet  Take 325 mg by mouth daily with breakfast.    [provider]  FLUoxetine (PROZAC) 40 MG capsule Take 40 mg by mouth daily.    [provider]  fluticasone (FLONASE) 50 MCG/ACT nasal spray USE 1 SPRAY NASALLY TWICE  DAILY    [provider]  Fluticasone-Umeclidin-Vilant (TRELEGY ELLIPTA) 100-62.5-25 MCG/ACT AEPB Inhale 1 puff into the lungs daily. 11/17/22   Parrett, Virgel Bouquet, NP  levothyroxine (SYNTHROID) 112 MCG tablet Take 112 mcg by mouth daily before breakfast.    [provider]  meclizine (ANTIVERT) 12.5 MG tablet Take 12.5 mg by mouth 3 (three) times daily. 08/23/21   [provider]  montelukast (SINGULAIR) 10 MG tablet Take 10 mg by mouth at bedtime.    [provider]  nitroGLYCERIN (NITROSTAT) 0.4 MG SL tablet Place 1 tablet (0.4 mg total) under the tongue every 5 (five) minutes as needed for chest pain. 04/08/21   Sande Rives, MD  pantoprazole (PROTONIX) 40 MG tablet Take 40 mg by mouth daily.    [provider]  tizanidine (ZANAFLEX) 2 MG capsule Take 2 mg by mouth daily.    [provider]  traZODone (DESYREL) 100 MG tablet Take 100 mg by mouth at bedtime.    [provider]  TRELEGY ELLIPTA 100-62.5-25 MCG/ACT AEPB Inhale 1 puff into the lungs daily. 11/17/22   Parrett, Virgel Bouquet, NP  vitamin E 180 MG (400 UNITS) capsule Take 400 Units by mouth daily. 450 mg (1,000 unit) capsule    [provider]      Allergies    Patient has no known allergies.    Review of Systems   Review of Systems  Physical Exam Updated Vital Signs BP (!) 137/57   Pulse 68   Temp 98.7 F (37.1 C) (Oral)   Resp 16   Ht 5\' 5"  (1.651 m)   Wt 81.6 kg   SpO2 98%   BMI 29.95 kg/m  Physical Exam Vitals and nursing note reviewed.  Constitutional:      General: She is not in acute distress.    Appearance: She is well-developed. She is not diaphoretic.  HENT:     Head: Normocephalic and atraumatic.  Eyes:      Pupils: Pupils are equal, round, and reactive to light.  Cardiovascular:     Rate and Rhythm: Normal rate and regular rhythm.     Heart sounds: No murmur heard.    No friction rub. No gallop.  Pulmonary:     Effort: Pulmonary effort is normal.     Breath sounds: No wheezing or rales.  Abdominal:     General: There is no distension.     Palpations: Abdomen is soft.     Tenderness: There is no abdominal tenderness.  Musculoskeletal:        General: No tenderness.     Cervical back: Normal range of motion and neck supple.  Skin:    General: Skin is warm and dry.  Neurological:     Mental Status: She is alert and oriented to person, place, and time.  Psychiatric:        Behavior: Behavior normal.     ED Results / Procedures / Treatments   Labs (all labs ordered are listed, but only abnormal results are displayed) Labs Reviewed  CBC WITH DIFFERENTIAL/PLATELET - Abnormal; Notable for the following components:      Result Value   RBC 3.84 (*)    Hemoglobin 11.7 (*)    HCT 35.4 (*)    All other components within normal limits  BASIC METABOLIC PANEL - Abnormal; Notable for the following components:   Glucose, Bld 102 (*)    BUN 5 (*)    Calcium 8.0 (*)    All other components within normal limits    EKG None  Radiology No results found.  Procedures Procedures    Medications Ordered in ED Medications - No data to display  ED Course/ Medical Decision Making/ A&P                                 Medical Decision Making Amount and/or Complexity of Data Reviewed Labs: ordered.   72 yo F with a chief complaints of wanting her sodium level rechecked.  She tells me she saw her endocrinologist via telehealth today and they had encouraged her to come to the ED to have it rechecked.  She was seen in the ED a little bit too over 10 days ago and was found to have a sodium of 125 and was offered admission which she had declined.  She says she still coughing quite a bit  but does not feel like its gotten any worse.  She  has clear lung sounds for me on exam.  Her sodium level today is normal.  She has no significant anemia no significant electrolyte abnormality otherwise.  Will discharge home.  PCP follow-up.  3:32 PM:  I have discussed the diagnosis/risks/treatment options with the patient and family.  Evaluation and diagnostic testing in the emergency department does not suggest an emergent condition requiring admission or immediate intervention beyond what has been performed at this time.  They will follow up with PCP, endo. We also discussed returning to the ED immediately if new or worsening sx occur. We discussed the sx which are most concerning (e.g., sudden worsening pain, fever, inability to tolerate by mouth) that necessitate immediate return. Medications administered to the patient during their visit and any new prescriptions provided to the patient are listed below.  Medications given during this visit Medications - No data to display   The patient appears reasonably screen and/or stabilized for discharge and I doubt any other medical condition or other [facility] requiring further screening, evaluation, or treatment in the ED at this time prior to discharge.          Final Clinical Impression(s) / ED Diagnoses Final diagnoses:  Hyponatremia    Rx / DC Orders ED Discharge Orders     None         Melene Plan, DO 12/16/22 1532

## 2022-12-21 ENCOUNTER — Telehealth: Payer: Self-pay | Admitting: Adult Health

## 2022-12-21 NOTE — Telephone Encounter (Signed)
CPAP Machine is broken and needs someone to call her back.

## 2022-12-27 NOTE — Telephone Encounter (Signed)
Patient states her CPAP machine comes on but immediately turns itself off. This has been happening for almost 2 weeks. She states she contacted [facility] and they advised she would need repeat sleep study, even though she states she had one 04/2021. She is confused by this.   Per chart patient is also seeing [facility]. Advised patient we would need to determine if she should continue seeing both offices, as it seems we have also been changing her COPD meds.   Spoke with [facility] helpline. She was not able to get anyone in the office on the phone. She has sent a message asking them to return the call.

## 2022-12-30 ENCOUNTER — Telehealth: Payer: Self-pay | Admitting: Adult Health

## 2022-12-30 NOTE — Telephone Encounter (Signed)
Patient is calling back and would like to speak with Kayla Solomon. She states that she needs a new CPAP. Please call back at [phone]

## 2023-01-02 ENCOUNTER — Telehealth: Payer: Self-pay | Admitting: Adult Health

## 2023-01-02 DIAGNOSIS — G4733 Obstructive sleep apnea (adult) (pediatric): Secondary | ICD-10-CM

## 2023-01-02 NOTE — Telephone Encounter (Signed)
Patient spoke to [facility]. Stated patient needs to schedule sleep study. Patient phone number is [phone].

## 2023-01-02 NOTE — Telephone Encounter (Signed)
Order placed for [facility] to evaluate patients concern.  New sleep study should not be needed.

## 2023-01-02 NOTE — Telephone Encounter (Signed)
Spoke with [employer]. Patient received CPAP machine in 2023. Not eligible for replacement at this time. Per rep she can contact [employer] to have the current machine evaluated for any issues.   Spoke with patient and provided phone number for [employer]. She will contact them. Nothing further needed at this time.

## 2023-01-09 ENCOUNTER — Telehealth: Payer: Self-pay | Admitting: Adult Health

## 2023-01-09 NOTE — Telephone Encounter (Signed)
Patient is currently recovering from covid. She wants to know if she can use nebulizer and trelegy olipta hours a part to combat her coughing or should she use them on different days. Please call and advise [phone]

## 2023-01-09 NOTE — Telephone Encounter (Signed)
Called and spoke with patient, advised patient that she can use her Trelegy inhaler daily and also use her rescue inhaler/nebulizer as needed.  Instructed not to use rescue inhaler or nebulizer at the same time, to wait the prescribed amount of time between uses as it is the same medication.  She states her symptoms are getting better from Covid.  She missed the window for Paxlovid because of a mix up at the pharmacy.  Scheduled to follow up with Dr. Sherene Sires on 03/21/23 at 2:15 pm, advised to arrive by 2 pm for check in.  She verbalized understanding.  Noting further needed.

## 2023-01-17 NOTE — Progress Notes (Unsigned)
Cardiology Office Note:    Date:  01/19/2023   ID:  Kayla Solomon, DOB December 27, 1950, MRN [id number]  PCP:  Loura Back, NP   [facility] Providers Cardiologist:  Chrystie Nose, MD { Referring MD: Loura Back, NP   Chief Complaint  Patient presents with   Follow-up    Chest pain    History of Present Illness:    Kayla Solomon is a 72 y.o. 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  Previously had care in [place].  Nuclear stress test 01/06/2021 was nonischemic.  Follow-up coronary calcium score 01/12/2021 was 30 placing her at the 54th percentile.  Echocardiogram 12/29/2021 showed an LVEF of 60 to 65%, no RWMA, mild LVH, grade 1 DD, normal PASP, moderate LAE, and no significant valvular disease.  At last visit with Dr. Rennis Golden she was concerned about motor impairment with question for TIA and was referred to neurology.  She presents today for annual follow-up. She had COVID infection that lasted 2 months in the setting of COPD, she continues to cough with chest tightness. CP occurs without regular pattern, not necessarily exertional. She fell this morning getting out of bed incorrectly and has bruising under left eye.   When asking about chest pain, she states it is constant, but lying down makes the CP worse, sitting up improves the pain. Rated 2-3 out of 10. COVID infection occurred 2 months ago. Activity like walking the dog and doing laundry does not precipitate chest pain but can make her dizzy. Chest pain is pleuritic.    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   Hiatal hernia 04/07/2022   Hypercholesterolemia 01/23/2018   Hyperlipidemia, unspecified 03/30/2018   Hypertension, essential 02/12/2018   Hypothyroidism    Insomnia 04/07/2022   Iron deficiency anemia 04/07/2022   Irritable bowel syndrome 01/23/2018   Mild cognitive impairment of uncertain or unknown etiology 05/16/2022   Mild persistent asthma 04/10/2018   Obstructive sleep apnea 01/23/2018   Osteopenia 04/07/2022   Osteoporosis 01/23/2018   Peripheral vascular disease 04/07/2022   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   Polyp of nasal cavity 04/10/2018   Recurrent falls 04/07/2022   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   Ascorbic Acid (VITAMIN C) 1000 MG tablet Take 1,000 mg by mouth daily.   atorvastatin (LIPITOR) 20 MG tablet Take 20 mg by mouth daily.   Biotin 81191 MCG TABS Take 1 capsule by mouth daily.    co-enzyme Q-10 30 MG capsule Take 30 mg by mouth 2 (two) times daily.   Cobalamin Combinations (B-12) [phone] MCG SUBL Take 5,000 mcg by mouth daily. 2 daily   desmopressin (DDAVP) 0.1 MG tablet Take 0.1 mg by mouth 2 (two) times daily.   ferrous sulfate 325 (65 FE) MG tablet Take 325 mg by mouth daily with breakfast.   FLUoxetine (PROZAC) 40 MG capsule Take 40 mg by mouth daily.   Fluticasone-Umeclidin-Vilant (TRELEGY ELLIPTA) 100-62.5-25 MCG/ACT AEPB Inhale 1 puff into the lungs daily.   levothyroxine (SYNTHROID) 112 MCG tablet Take 112 mcg by mouth daily before breakfast.   montelukast (SINGULAIR) 10 MG tablet Take 10 mg by mouth at bedtime.   nitroGLYCERIN (NITROSTAT) 0.4 MG SL tablet Place 1 tablet (0.4 mg total) under the tongue every 5 (five) minutes as needed for chest pain.   pantoprazole (PROTONIX) 40 MG tablet Take 40 mg by mouth daily.   tizanidine (ZANAFLEX) 2 MG capsule Take 2 mg by mouth daily.   traZODone (DESYREL) 100 MG tablet Take 100 mg by mouth at bedtime.   TRELEGY ELLIPTA 100-62.5-25 MCG/ACT AEPB Inhale 1 puff into the lungs daily.   vitamin E 180 MG (400 UNITS) capsule Take 400 Units by mouth daily. 450 mg (1,000 unit) capsule     Allergies:   Patient has no known allergies.   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[employer]; Runner, broadcasting/film/video  Tobacco Use   Smoking status: Never   Smokeless tobacco: Never  Vaping Use   Vaping status: Never Used  Substance and Sexual Activity   Alcohol use: Not Currently   Drug use: Never   Sexual activity: Not Currently  Other Topics Concern   Not on file  Social History Narrative   Left Handed   Caff 2 cups   One story home with daughter and husband   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  ROS:   Please see the history of present illness.     All other systems reviewed and are negative.  EKGs/Labs/Other Studies Reviewed:    The following studies  were reviewed today:  Echo 12/29/21: 1. Left ventricular ejection fraction, by estimation, is 60 to 65%. Left  ventricular ejection fraction by 3D volume is 65 %. The left ventricle has  normal function. The left ventricle has no regional wall motion  abnormalities. There is mild left  ventricular hypertrophy. Left ventricular diastolic parameters are  consistent with Grade I diastolic dysfunction (impaired relaxation).   2. Right ventricular systolic function is normal. The right ventricular  size is normal. There is normal pulmonary artery systolic pressure. The  estimated right ventricular systolic pressure is 35.7 mmHg.   3. Left atrial size was moderately dilated.   4. The mitral valve is grossly normal. Trivial mitral valve  regurgitation.   5. The aortic valve is tricuspid. Aortic valve regurgitation is not  visualized.   6. The inferior vena cava is normal in size with greater than 50%  respiratory variability, suggesting right atrial pressure of 3 mmHg.  EKG Interpretation Date/Time:  Thursday January 19 2023 12:24:40 EDT Ventricular Rate:  59 PR Interval:  144 QRS Duration:  82 QT Interval:  464 QTC Calculation: 459 R Axis:   20  Text Interpretation: Sinus bradycardia ST & T wave abnormality, consider anterior ischemia  When compared with ECG of 04-Dec-2022 08:10, Vent. rate has decreased BY  32 BPM T wave inversion now evident in Anterior leads Nonspecific T wave abnormality, improved in Lateral leads Confirmed by Micah Flesher ([id number]) on 01/19/2023 12:26:54 PM    Recent Labs: 12/16/2022: BUN 5; Creatinine, Ser 0.81; Hemoglobin 11.7; Platelets 274; Potassium 3.8; Sodium 135  Recent Lipid Panel No results found for: "CHOL", "TRIG", "HDL", "CHOLHDL", "VLDL", "LDLCALC", "LDLDIRECT"   Risk Assessment/Calculations:                Physical Exam:    VS:  BP 102/64 (BP Location: Left Arm, Patient Position: Sitting, Cuff Size: Normal)   Pulse 64   Ht 5\' 5"  (1.651 m)   Wt 185 lb 12.8 oz (84.3 kg)   SpO2 98%   BMI 30.92 kg/m     Wt Readings from Last 3 Encounters:  01/19/23 185 lb 12.8 oz (84.3 kg)  12/16/22 180 lb (81.6 kg)  12/04/22 189 lb 9.5 oz (86 kg)     GEN:  Well nourished, well developed in no acute distress HEENT: Normal NECK: No JVD; No carotid bruits LYMPHATICS: No lymphadenopathy CARDIAC: RRR, no murmurs, rubs, gallops RESPIRATORY:  Clear to auscultation without rales, wheezing or rhonchi  ABDOMEN: Soft, non-tender, non-distended MUSCULOSKELETAL:  No edema; No deformity  SKIN: Warm and dry NEUROLOGIC:  Alert and oriented x 3 PSYCHIATRIC:  Normal affect   ASSESSMENT:    1. Chest pain of uncertain etiology   2. Fall, initial encounter   3. Elevated coronary artery calcium score   4. Chronic diastolic congestive heart failure (HCC)   5. Primary hypertension   6. Hyperlipidemia with target LDL less than 70    PLAN:    In order of problems listed above:  Chest pain - pleuritic in nature, constant, better when sitting up/worse when flat, worse with deep inspiration, started after COVID infection, no rub on exam - will check sed rate, CRP, and echocardiogram - she has not tried NSAIDs - if pericarditis, would treat with colchicine x 3 months - EKG today with TWI anterior leads  stable from prior tracing (2022)   Fall this morning - I do not feel a step-off on her orbit, but have recommended xray to rule out  orbital fracture at urgent care   Elevated coronary calcium score Reassuring nuclear stress test 12/2020 Focus on risk factor modification -Not on aspirin - she does not want to take ASA   Mild diastolic dysfunction without heart failure - appears euvolemic   Hypertension -10 mg amlodipine   Dyslipidemia -20 mg Lipitor    Follow up in 3 months.      Medication Adjustments/Labs and Tests Ordered: Current medicines are reviewed at length with the patient today.  Concerns regarding medicines are outlined above.  Orders Placed This Encounter  Procedures   C-reactive protein   Sedimentation rate   EKG 12-Lead   ECHOCARDIOGRAM COMPLETE   No orders of the defined types were placed in this encounter.   Patient Instructions  Medication Instructions:  No changes *If you need a refill on your cardiac medications before your next appointment, please call your pharmacy*   Lab Work: CRP, Sedimentation rate- today If you have labs (blood work) drawn today and your tests are completely normal, you will receive your results only by: MyChart Message (if you have MyChart) OR A paper copy in the mail If you have any lab test that is abnormal or we need to change your treatment, we will call you to review the results.  Procedures: Your physician has requested that you have an echocardiogram. Echocardiography is a painless test that uses sound waves to create images of your heart. It provides your doctor with information about the size and shape of your heart and how well your heart's chambers and valves are working. This procedure takes approximately one hour. There are no restrictions for this procedure. Please do NOT wear cologne, perfume, aftershave, or lotions (deodorant is allowed). Please arrive 15 minutes prior to your appointment time.     Please go to Urgent Care for Orbital Evaluation and possible X-Ray.    Follow-Up: At [facility], you and your health needs are our priority.  As part of our continuing mission to provide you with exceptional heart care, we have created designated Provider Care Teams.  These Care Teams include your primary Cardiologist (physician) and Advanced Practice Providers (APPs -  Physician Assistants and Nurse Practitioners) who all work together to provide you with the care you need, when you need it.  We recommend signing up for the patient portal called "MyChart".  Sign up information is provided on this After Visit Summary.  MyChart is used to connect with patients for Virtual Visits (Telemedicine).  Patients are able to view lab/test results, encounter notes, upcoming appointments, etc.  Non-urgent messages can be sent to your provider as well.   To learn more about what you can do with MyChart, go to [email or web address].    Your next appointment:    3-4 months  Provider:  Micah Flesher, PA     Signed, Roe Rutherford Krebs, [place]  01/19/2023 12:33 PM    [facility]

## 2023-01-19 ENCOUNTER — Ambulatory Visit: Payer: Medicare HMO | Attending: Adult Health | Admitting: Physician Assistant

## 2023-01-19 ENCOUNTER — Encounter: Payer: Self-pay | Admitting: Physician Assistant

## 2023-01-19 VITALS — BP 102/64 | HR 64 | Ht 65.0 in | Wt 185.8 lb

## 2023-01-19 DIAGNOSIS — R931 Abnormal findings on diagnostic imaging of heart and coronary circulation: Secondary | ICD-10-CM

## 2023-01-19 DIAGNOSIS — I5032 Chronic diastolic (congestive) heart failure: Secondary | ICD-10-CM | POA: Diagnosis not present

## 2023-01-19 DIAGNOSIS — R079 Chest pain, unspecified: Secondary | ICD-10-CM

## 2023-01-19 DIAGNOSIS — W19XXXA Unspecified fall, initial encounter: Secondary | ICD-10-CM | POA: Diagnosis not present

## 2023-01-19 DIAGNOSIS — I10 Essential (primary) hypertension: Secondary | ICD-10-CM

## 2023-01-19 DIAGNOSIS — E785 Hyperlipidemia, unspecified: Secondary | ICD-10-CM

## 2023-01-19 NOTE — Patient Instructions (Addendum)
Medication Instructions:  No changes *If you need a refill on your cardiac medications before your next appointment, please call your pharmacy*   Lab Work: CRP, Sedimentation rate- today If you have labs (blood work) drawn today and your tests are completely normal, you will receive your results only by: MyChart Message (if you have MyChart) OR A paper copy in the mail If you have any lab test that is abnormal or we need to change your treatment, we will call you to review the results.  Procedures: Your physician has requested that you have an echocardiogram. Echocardiography is a painless test that uses sound waves to create images of your heart. It provides your doctor with information about the size and shape of your heart and how well your heart's chambers and valves are working. This procedure takes approximately one hour. There are no restrictions for this procedure. Please do NOT wear cologne, perfume, aftershave, or lotions (deodorant is allowed). Please arrive 15 minutes prior to your appointment time.    Please go to Urgent Care for Orbital Evaluation and possible X-Ray.    Follow-Up: At [facility], you and your health needs are our priority.  As part of our continuing mission to provide you with exceptional heart care, we have created designated Provider Care Teams.  These Care Teams include your primary Cardiologist (physician) and Advanced Practice Providers (APPs -  Physician Assistants and Nurse Practitioners) who all work together to provide you with the care you need, when you need it.  We recommend signing up for the patient portal called "MyChart".  Sign up information is provided on this After Visit Summary.  MyChart is used to connect with patients for Virtual Visits (Telemedicine).  Patients are able to view lab/test results, encounter notes, upcoming appointments, etc.  Non-urgent messages can be sent to your provider as well.   To learn more about what you  can do with MyChart, go to [email or web address].    Your next appointment:    3-4 months  Provider:  Micah Flesher, PA

## 2023-01-20 LAB — C-REACTIVE PROTEIN: CRP: <1 mg/L (ref 0–10)

## 2023-01-20 LAB — SEDIMENTATION RATE: Sed Rate: 3 mm/h (ref 0–40)

## 2023-02-02 ENCOUNTER — Ambulatory Visit (HOSPITAL_COMMUNITY): Payer: Medicare HMO | Attending: Physician Assistant

## 2023-02-02 DIAGNOSIS — R079 Chest pain, unspecified: Secondary | ICD-10-CM | POA: Diagnosis present

## 2023-02-02 LAB — ECHOCARDIOGRAM COMPLETE
Area-P 1/2: 2.61 cm2
S' Lateral: 2.9 cm

## 2023-02-08 ENCOUNTER — Telehealth: Payer: Self-pay

## 2023-02-08 NOTE — Telephone Encounter (Signed)
Spoke with pt. Pt was notified of echo results. Pt will continue current medication and f/u as planned.

## 2023-03-20 NOTE — Progress Notes (Unsigned)
Kayla Solomon, female    DOB: [birth year],   MRN: [id number]   Brief patient profile:  48 yowf   never smoker  referred to pulmonary clinic 11/27/2020 by Loura Back NP for asthma eval.  Seasonal allergy around age 72 with nasal polyps /surgery 1995 [place]  and asthma in her 54's while living in [place] > dx by pulmonary as asthma but says rx trelegy helped some there but since in [facility] March 2022  breathing worse to point of sob at rest.    History of Present Illness  11/27/2020  Pulmonary/ 1st office eval/Glenn Christo  Chief Complaint  Patient presents with   Consult    Having sob some difficult breathing.   Dyspnea:  [employer] p 2 aisles  Cough: not now  Sleep: tilted up at 3-4 pillows and cpap  SABA use: not working  Ent eval Dr Suszanne Conners no change rx per pt   Rec Only use your albuterol as a rescue medication  Also ok to Try albuterol 15 min before an activity (on alternating days)  that you know would make you short of breath  Work on inhaler technique:   Ok to leave off trelegy    01/20/2021  f/u ov/Georgann Bramble re: doe ?  why   maint on no longer on trelegy > no change doe   Chief Complaint  Patient presents with   Follow-up  Dyspnea: sev aisles at [employer]  Cough: some daytime  pnds sensation but not noct  Sleeping: fine on cpap [place]  SABA use: none  02: none  Rec Pantoprazole should be Take 30- 60 min before your first and last meals of the day  GERD diet reviewed, bed blocks rec  We will walk you around the office today for a new baseline off all respiratory medications  We will set you up for a lung function test and I will call you with the results  Dr Harriette Ohara is the expert on voice disorders ([facility])         03/21/2023  Re-establish ov/Rajeev Escue re: doe  maint on trelegy 100 x several months   Chief Complaint  Patient presents with   Follow-up    Doing well today.  Hx of cough x 2 months (September-October 2024)   Dyspnea:  limited by knees /leg fatigue > sob   Cough: no longer coughing at time fo ov  Sleeping: cpap  per Vassie Loll s  resp cc lying flat  x 2 week SABA use: once a week  02: none  Stressed out dealing with mentally ill daughter    No obvious day to day or daytime variability or assoc excess/ purulent sputum or mucus plugs or hemoptysis or cp or chest tightness, subjective wheeze or overt sinus or hb symptoms.    Also denies any obvious fluctuation of symptoms with weather or environmental changes or other aggravating or alleviating factors except as outlined above   No unusual exposure hx or h/o childhood pna/ asthma or knowledge of premature birth.  Current Allergies, Complete Past Medical History, Past Surgical History, Family History, and Social History were reviewed in [employer] record.  ROS  The following are not active complaints unless bolded Hoarseness, sore throat, dysphagia, dental problems, itching, sneezing,  nasal congestion or discharge of excess mucus or purulent secretions, ear ache,   fever, chills, sweats, unintended wt loss or wt gain, classically pleuritic or exertional cp,  orthopnea pnd or arm/hand swelling  or leg swelling, presyncope,  palpitations, abdominal pain, anorexia, nausea, vomiting, diarrhea  or change in bowel habits or change in bladder habits, change in stools or change in urine, dysuria, hematuria,  rash, arthralgias, visual complaints, headache, numbness, weakness or ataxia or problems with walking or coordination,  change in mood or  memory.        Current Meds  Medication Sig   albuterol (VENTOLIN HFA) 108 (90 Base) MCG/ACT inhaler INHALE 1 PUFF BY MOUTH THREE TIMES DAILY AS NEEDED FOR 30 DAYS   ALPRAZolam (XANAX) 1 MG tablet    amLODipine (NORVASC) 10 MG tablet Take 10 mg by mouth daily.   Ascorbic Acid (VITAMIN C) 1000 MG tablet Take 1,000 mg by mouth daily.   atorvastatin (LIPITOR) 20 MG tablet Take 20 mg by mouth daily.   Biotin 28413 MCG TABS Take 1 capsule by mouth  daily.   co-enzyme Q-10 30 MG capsule Take 30 mg by mouth 2 (two) times daily.   Cobalamin Combinations (B-12) [phone] MCG SUBL Take 5,000 mcg by mouth daily. 2 daily   desmopressin (DDAVP) 0.1 MG tablet Take 0.1 mg by mouth 2 (two) times daily.   ferrous sulfate 325 (65 FE) MG tablet Take 325 mg by mouth daily with breakfast.   FLUoxetine (PROZAC) 40 MG capsule Take 40 mg by mouth daily.   fluticasone (FLONASE) 50 MCG/ACT nasal spray    Fluticasone-Umeclidin-Vilant (TRELEGY ELLIPTA) 100-62.5-25 MCG/ACT AEPB Inhale 1 puff into the lungs daily.   levothyroxine (SYNTHROID) 112 MCG tablet Take 112 mcg by mouth daily before breakfast.   meclizine (ANTIVERT) 12.5 MG tablet Take 12.5 mg by mouth 3 (three) times daily.   montelukast (SINGULAIR) 10 MG tablet Take 10 mg by mouth at bedtime.   nitroGLYCERIN (NITROSTAT) 0.4 MG SL tablet Place 1 tablet (0.4 mg total) under the tongue every 5 (five) minutes as needed for chest pain.   pantoprazole (PROTONIX) 40 MG tablet Take 40 mg by mouth daily.   tizanidine (ZANAFLEX) 2 MG capsule Take 2 mg by mouth daily.   traZODone (DESYREL) 100 MG tablet Take 100 mg by mouth at bedtime.   TRELEGY ELLIPTA 100-62.5-25 MCG/ACT AEPB Inhale 1 puff into the lungs daily.   vitamin E 180 MG (400 UNITS) capsule Take 400 Units by mouth daily. 450 mg (1,000 unit) capsule            Past Medical History:  Diagnosis Date   Asthma    COPD (chronic obstructive pulmonary disease) (HCC)    Diabetes mellitus without complication (HCC)    GERD (gastroesophageal reflux disease)    Hyperlipidemia    Hypertension    Hypothyroidism    OSA (obstructive sleep apnea)         Objective:    Wts   03/21/2023     187  01/20/21 177 lb 6.4 oz (80.5 kg)  01/14/21 177 lb 12.8 oz (80.6 kg)  01/06/21 178 lb (80.7 kg)    Vital signs reviewed  03/21/2023  - Note at rest 02 sats  96% on RA   General appearance:    somber amb wf nad   HEENT : Oropharynx  clear       NECK :   without  apparent JVD/ palpable Nodes/TM    LUNGS: no acc muscle use,  Nl contour chest which is clear to A and P bilaterally without cough on insp or exp maneuvers   CV:  RRR  no s3 or murmur or increase in P2, and no edema   ABD:  soft and nontender    MS:  Nl gait/ ext warm without deformities Or obvious joint restrictions  calf tenderness, cyanosis or clubbing    SKIN: warm and dry without lesions    NEURO:  alert, approp, nl sensorium with  no motor or cerebellar deficits apparent.         I personally reviewed images and agree with radiology impression as follows:  CXR:   pa and laterl 12/04/22 No acute cardiopulmonary  process       Assessment

## 2023-03-21 ENCOUNTER — Encounter: Payer: Self-pay | Admitting: Internal Medicine

## 2023-03-21 ENCOUNTER — Ambulatory Visit: Payer: Medicare HMO | Admitting: Internal Medicine

## 2023-03-21 VITALS — BP 126/62 | HR 61 | Temp 97.4°F | Ht 65.0 in | Wt 187.4 lb

## 2023-03-21 DIAGNOSIS — R0609 Other forms of dyspnea: Secondary | ICD-10-CM

## 2023-03-21 MED ORDER — TRELEGY ELLIPTA 100-62.5-25 MCG/ACT IN AEPB: 1.0000 | INHALATION_SPRAY | Freq: Every day | RESPIRATORY_TRACT | Status: AC

## 2023-03-21 NOTE — Assessment & Plan Note (Signed)
Onset in her 60's in setting of h/o nasal polyps dx as asthma ? some better on trelegy while in [place] -  11/27/2020   Walked RA 3 laps @ approx 212ft each @ avg pace  stopped due to sob with sats no lower than 95%  -  11/27/2020  After extensive coaching inhaler device,  effectiveness =   50% from a baseline near 0  Allergy profile 11/27/2020 >  Eos 0.0 /  IgE 16   11/27/20  : alpha one AT level = 169   - .01/20/2021 no wore off trelegy x >  1 m - 01/20/2021   Walked on RA x  2  lap(s) =  approx 500 @ fast pace, stopped due to end of study s sob  with lowest 02 sats 95% - PFT's 01/20/2021 >>>not done as rec   Doing better on trelegy though not clear at all she has asthma but if she does All goals of chronic asthma control met including optimal function and elimination of symptoms with minimal need for rescue therapy.  Contingencies discussed in full including contacting this office immediately if not controlling the symptoms using the rule of two's.     F/u can be yearly for refills with f/u with Dr Vassie Loll prn for sleep         Each maintenance medication was reviewed in detail including emphasizing most importantly the difference between maintenance and prns and under what circumstances the prns are to be triggered using an action plan format where appropriate.  Total time for H and P, chart review, counseling, reviewing dpi/hfa  device(s) and generating customized AVS unique to this office visit / same day charting = 25 min

## 2023-03-21 NOTE — Patient Instructions (Addendum)
Follow up is per Dr Vassie Loll at [facility] or here NP   Think of trelegy like you do the highest rated octane gas for your car   Follow up in this clinic is as needed

## 2023-03-21 NOTE — Addendum Note (Signed)
Addended byClyda Greener M on: 03/21/2023 03:08 PM   Modules accepted: Orders

## 2023-04-20 ENCOUNTER — Telehealth: Payer: Self-pay

## 2023-04-20 ENCOUNTER — Other Ambulatory Visit (HOSPITAL_COMMUNITY): Payer: Self-pay

## 2023-04-20 NOTE — Telephone Encounter (Signed)
Pharmacy Patient Advocate Encounter   Received notification from CoverMyMeds that prior authorization for Trelegy Ellipta 100-62.5-25MCG/ACT aerosol powder  is required/requested.   Insurance verification completed.   The patient is insured through [employer] .   Per test claim: The current 30 day co-pay is, $45.  No PA needed at this time. This test claim was processed through [facility]- copay amounts may vary at other pharmacies due to pharmacy/plan contracts, or as the patient moves through the different stages of their insurance plan.

## 2023-04-23 NOTE — Progress Notes (Signed)
 Cardiology Office Note:    Date:  04/25/2023   ID:  Kayla Solomon, DOB [birth year], MRN [id number]  PCP:  Leontine Cramp, NP   [facility] Providers Cardiologist:  Vinie JAYSON Maxcy, MD     Referring MD: Leontine Cramp, NP   Chief Complaint  Patient presents with   Follow-up    Chest pain    History of Present Illness:    Kayla Solomon is a 72 y.o. 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  Previously had care in [place] .  Nuclear stress test 01/06/2021 was nonischemic.  Follow-up coronary calcium score 01/12/2021 was 30 placing her at the 54th percentile.  Echocardiogram 12/29/2021 showed an LVEF of 60 to 65%, no RWMA, mild LVH, grade 1 DD, normal PASP, moderate LAE, and no significant valvular disease.  At last visit with Dr. Maxcy she was concerned about motor impairment with question for TIA and was referred to neurology.  I saw her for annual follow-up 01/19/2023 and she reported COVID infection that lasted 2 months in the setting of COPD.  She also reported chest tightness with cough, and pleuritic type chest pain.  She also fell this morning getting out of bed incorrectly and struck the left side of her face under her left eye.  Sed rate and CRP were WNL. She had her left orbit evaluated at urgent care that same day.  Echocardiogram 02/02/2023 with LVEF 60-65%, grade 1 DD, no RWMA, normal RV function, mild LAE, and no significant valvular disease.  She presents for 43-month follow-up.  She states she is still unsteady on her feet. She states her mentally ill daughter is causing a lot of stress in the house. Daughter wants to buy a bird and has adopted a one-eyed medical laboratory scientific officer. She has bought many stuffed animals. Pt states she has chest pain that feels like an elephant sitting on her chest. She has been diagnosed with fibromyalgia by seven doctors. Her husband has parkinson's and is not participating in the prescribed PT or  medication regimen. She is unable to turn off her electric toothbrush.  She wants to live in independent or assisted living because she doesn't feel like cooking or cleaning, she feels she can't maintain an apartment.   In terms of chest pain, this occurs when sitting or moving, she is very sedentary, but does take care of husband, although also states she has a cleaning service at her house. Difficult to discern what is caused by one of her other diagnoses and what may be angina. Chest pain may be changed with position.    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   Hiatal hernia 04/07/2022   Hypercholesterolemia 01/23/2018   Hyperlipidemia, unspecified 03/30/2018   Hypertension, essential 02/12/2018   Hypothyroidism    Insomnia 04/07/2022   Iron deficiency anemia 04/07/2022   Irritable bowel syndrome 01/23/2018   Mild cognitive impairment of uncertain or unknown etiology 05/16/2022   Mild persistent asthma  04/10/2018   Obstructive sleep apnea 01/23/2018   Osteopenia 04/07/2022   Osteoporosis 01/23/2018   Peripheral vascular disease 04/07/2022   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   Ascorbic Acid (VITAMIN C) 1000 MG tablet Take 1,000 mg by mouth daily.   atorvastatin (LIPITOR) 20 MG tablet Take 20 mg by mouth daily.   Biotin 89999 MCG TABS Take 1 capsule by mouth daily.   co-enzyme Q-10 30 MG capsule Take 30 mg by mouth 2 (two) times daily.   Cobalamin Combinations (B-12) [phone] MCG SUBL Take 5,000 mcg by mouth daily. 2 daily   desmopressin (DDAVP) 0.1 MG tablet Take 0.1 mg by mouth 2 (two) times daily.   ferrous sulfate 325 (65 FE) MG tablet Take 325 mg by mouth daily with breakfast.   FLUoxetine (PROZAC) 40 MG capsule Take 40 mg by mouth daily.   fluticasone (FLONASE) 50 MCG/ACT nasal spray    Fluticasone-Umeclidin-Vilant (TRELEGY ELLIPTA ) 100-62.5-25 MCG/ACT AEPB Inhale 1 puff into the lungs daily.   isosorbide  mononitrate (IMDUR ) 30 MG 24 hr tablet Take 0.5 tablets (15 mg total) by mouth at bedtime.   levothyroxine (SYNTHROID) 112 MCG tablet Take 112 mcg by mouth daily before breakfast.   meclizine (ANTIVERT) 12.5 MG tablet Take 12.5 mg by mouth 3 (three) times daily.   montelukast (SINGULAIR) 10 MG tablet Take 10 mg by mouth at bedtime.   pantoprazole (PROTONIX) 40 MG tablet Take 40 mg by mouth daily.   tizanidine (ZANAFLEX) 2 MG  capsule Take 2 mg by mouth daily.   traZODone (DESYREL) 100 MG tablet Take 100 mg by mouth at bedtime.   vitamin E 180 MG (400 UNITS) capsule Take 400 Units by mouth daily. 450 mg (1,000 unit) capsule   [DISCONTINUED] nitroGLYCERIN  (NITROSTAT ) 0.4 MG SL tablet Place 1 tablet (0.4 mg total) under the tongue every 5 (five) minutes as needed for chest pain.     Allergies:   Patient has no known allergies.   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[employer]; Teacher  Tobacco Use   Smoking status: Never   Smokeless tobacco: Never  Vaping Use   Vaping status: Never Used  Substance and Sexual Activity   Alcohol use: Not Currently   Drug use: Never   Sexual activity: Not Currently  Other Topics Concern   Not on file  Social History Narrative   Left Handed   Caff 2 cups   One story home with daughter and husband   retired   Chief Executive Officer Drivers of [employer] Strain: Not on [employer] Insecurity: Not on file  Transportation Needs: Not on file  Physical Activity: Not on file  Stress: Not on file  Social Connections: Unknown (09/01/2021)   Received from [facility], [facility]   Social Network    Social Network: Not on file     Family History: The patient's family history includes Epilepsy in her sister; Heart attack in her maternal aunt; Heart attack (age of onset: 29) in her father; Stroke in her maternal uncle.  ROS:   Please see the history of present illness.     All other systems reviewed and are negative.  EKGs/Labs/Other Studies Reviewed:    The following studies were reviewed today:  Echo 01/2023:  1. Left ventricular ejection fraction, by estimation, is 60 to 65%. The  left ventricle has normal function. The left  ventricle has no regional  wall motion abnormalities. Left ventricular diastolic parameters are  consistent with Grade I diastolic  dysfunction (impaired relaxation).   2. Right ventricular systolic function is normal. The right ventricular  size is mildly enlarged.   3. Left atrial size was mildly dilated.   4. The mitral valve is normal in structure. No evidence of mitral valve  regurgitation. No evidence of mitral stenosis.   5. The aortic valve is tricuspid. Aortic valve regurgitation is not  visualized. No aortic stenosis is present.   6. The inferior vena cava is normal in size with greater than 50%  respiratory variability, suggesting right atrial pressure of 3 mmHg.    EKG Interpretation Date/Time:  Tuesday April 25 2023 14:48:38 EST Ventricular Rate:  59 PR Interval:  148 QRS Duration:  86 QT Interval:  436 QTC Calculation: 431 R Axis:   39  Text Interpretation: Sinus bradycardia ST & T wave abnormality, consider anterior ischemia When compared with ECG of 19-Jan-2023 12:24, No significant change was found Confirmed by Madie Slough ([id number]) on 04/25/2023 3:05:00 PM    Recent Labs: 12/16/2022: BUN 5; Creatinine, Ser 0.81; Hemoglobin 11.7; Platelets 274; Potassium 3.8; Sodium 135  Recent Lipid Panel No results found for: CHOL, TRIG, HDL, CHOLHDL, VLDL, LDLCALC, LDLDIRECT   Risk Assessment/Calculations:                Physical Exam:    VS:  BP 134/70   Pulse 65   Ht 5' 5 (1.651 m)   Wt 185 lb (83.9 kg)   SpO2 97%   BMI 30.79 kg/m     Wt Readings from Last 3 Encounters:  04/25/23 185 lb (83.9 kg)  03/21/23 187 lb 6.4 oz (85 kg)  01/19/23 185 lb 12.8 oz (84.3 kg)     GEN:  Well nourished, well developed in no acute distress HEENT: Normal NECK: No JVD; No carotid bruits LYMPHATICS: No lymphadenopathy CARDIAC: RRR, no murmurs, rubs, gallops RESPIRATORY:  Clear to auscultation without rales, wheezing or rhonchi  ABDOMEN: Soft, non-tender,  non-distended MUSCULOSKELETAL:  No edema; No deformity  SKIN: Warm and dry NEUROLOGIC:  Alert and oriented x 3 PSYCHIATRIC:  Normal affect   ASSESSMENT:    1. Primary hypertension   2. Chest pain of uncertain etiology   3. Elevated coronary artery calcium score   4. Diastolic dysfunction without heart failure   5. Hyperlipidemia with target LDL less than 70    PLAN:    In order of problems listed above:  Chest pain - stress test in 2022 was nonischemic - coronary calcium score showed only mild CAD - EKG today was not changed from prior - likely non-cardiac chest pain - I will add 15 mg imdur  at night, she also takes 1 mg xanax and trazadone at night - she requests a refill on her nitroglycerin  which she has not used in 4 years - if chest pain persists, may ultimately need CT coronary   Elevated coronary calcium score Reassuring nuclear stress test 12/2020 Focus on risk factor modification -Not on aspirin   Mild diastolic dysfunction without heart failure - euvolemic    Hypertension -10 mg amlodipine -- BP borderline -- add 15 mg imdur  as above   Dyslipidemia -20 mg Lipitor -- need updated lipid panel   Follow up with Dr. Mona in 6 months.           Medication Adjustments/Labs and Tests Ordered: Current medicines are reviewed at length with the patient today.  Concerns regarding medicines are outlined above.  Orders Placed This Encounter  Procedures   EKG 12-Lead   Meds ordered this encounter  Medications   isosorbide  mononitrate (IMDUR ) 30 MG 24 hr tablet    Sig: Take 0.5 tablets (15 mg total) by mouth at bedtime.    Dispense:  15 tablet    Refill:  6   nitroGLYCERIN  (NITROSTAT ) 0.4 MG SL tablet    Sig: Place 1 tablet (0.4 mg total) under the tongue every 5 (five) minutes as needed for chest pain.    Dispense:  25 tablet    Refill:  12    Patient Instructions  Medication Instructions:  START ISOSORBIDE  15MG  AT NIGHT *If you need a refill on  your cardiac medications before your next appointment, please call your pharmacy*  Lab Work: NONE  Testing/Procedures: NONE  Follow-Up: At [facility], you and your health needs are our priority.  As part of our continuing mission to provide you with exceptional heart care, we have created designated Provider Care Teams.  These Care Teams include your primary Cardiologist (physician) and Advanced Practice Providers (APPs -  Physician Assistants and Nurse Practitioners) who all work together to provide you with the care you need, when you need it.  We recommend signing up for the patient portal called MyChart.  Sign up information is provided on this After Visit Summary.  MyChart is used to connect with patients for Virtual Visits (Telemedicine).  Patients are able to view lab/test results, encounter notes, upcoming appointments, etc.  Non-urgent messages can be sent to your provider as well.   To learn more about what you can do with MyChart, go to [email or web address].    Your next appointment:   6 month(s)  Provider:   VINIE MONA, MD  Other Instructions        Signed, Jon Nat Hails, PA  04/25/2023 3:05 PM    [facility]

## 2023-04-25 ENCOUNTER — Encounter: Payer: Self-pay | Admitting: Physician Assistant

## 2023-04-25 ENCOUNTER — Ambulatory Visit: Payer: Medicare HMO | Attending: Physician Assistant | Admitting: Physician Assistant

## 2023-04-25 VITALS — BP 134/70 | HR 65 | Ht 65.0 in | Wt 185.0 lb

## 2023-04-25 DIAGNOSIS — R931 Abnormal findings on diagnostic imaging of heart and coronary circulation: Secondary | ICD-10-CM

## 2023-04-25 DIAGNOSIS — R079 Chest pain, unspecified: Secondary | ICD-10-CM

## 2023-04-25 DIAGNOSIS — I10 Essential (primary) hypertension: Secondary | ICD-10-CM | POA: Diagnosis not present

## 2023-04-25 DIAGNOSIS — I5189 Other ill-defined heart diseases: Secondary | ICD-10-CM

## 2023-04-25 DIAGNOSIS — E785 Hyperlipidemia, unspecified: Secondary | ICD-10-CM

## 2023-04-25 MED ORDER — ISOSORBIDE MONONITRATE ER 30 MG PO TB24: 15.0000 mg | ORAL_TABLET | Freq: Every day | ORAL | 6 refills | Status: DC

## 2023-04-25 MED ORDER — NITROGLYCERIN 0.4 MG SL SUBL: 0.4000 mg | SUBLINGUAL_TABLET | SUBLINGUAL | 12 refills | Status: AC | PRN

## 2023-04-25 NOTE — Patient Instructions (Addendum)
 Medication Instructions:  START ISOSORBIDE  15MG  AT NIGHT *If you need a refill on your cardiac medications before your next appointment, please call your pharmacy*  Lab Work: NONE  Testing/Procedures: NONE  Follow-Up: At [facility], you and your health needs are our priority.  As part of our continuing mission to provide you with exceptional heart care, we have created designated Provider Care Teams.  These Care Teams include your primary Cardiologist (physician) and Advanced Practice Providers (APPs -  Physician Assistants and Nurse Practitioners) who all work together to provide you with the care you need, when you need it.  We recommend signing up for the patient portal called MyChart.  Sign up information is provided on this After Visit Summary.  MyChart is used to connect with patients for Virtual Visits (Telemedicine).  Patients are able to view lab/test results, encounter notes, upcoming appointments, etc.  Non-urgent messages can be sent to your provider as well.   To learn more about what you can do with MyChart, go to [email or web address].    Your next appointment:   6 month(s)  Provider:   VINIE MAXCY, MD  Other Instructions

## 2023-04-28 ENCOUNTER — Ambulatory Visit: Payer: Medicare HMO | Admitting: Physician Assistant

## 2023-09-03 IMAGING — DX DG FOOT COMPLETE 3+V*R*
3 series · 3 of 3 positions shown · non-contrast
Comparison: None.

CLINICAL DATA: Right toe pain after injury yesterday.

EXAM:
RIGHT FOOT COMPLETE - 3+ VIEW

[foot ap]
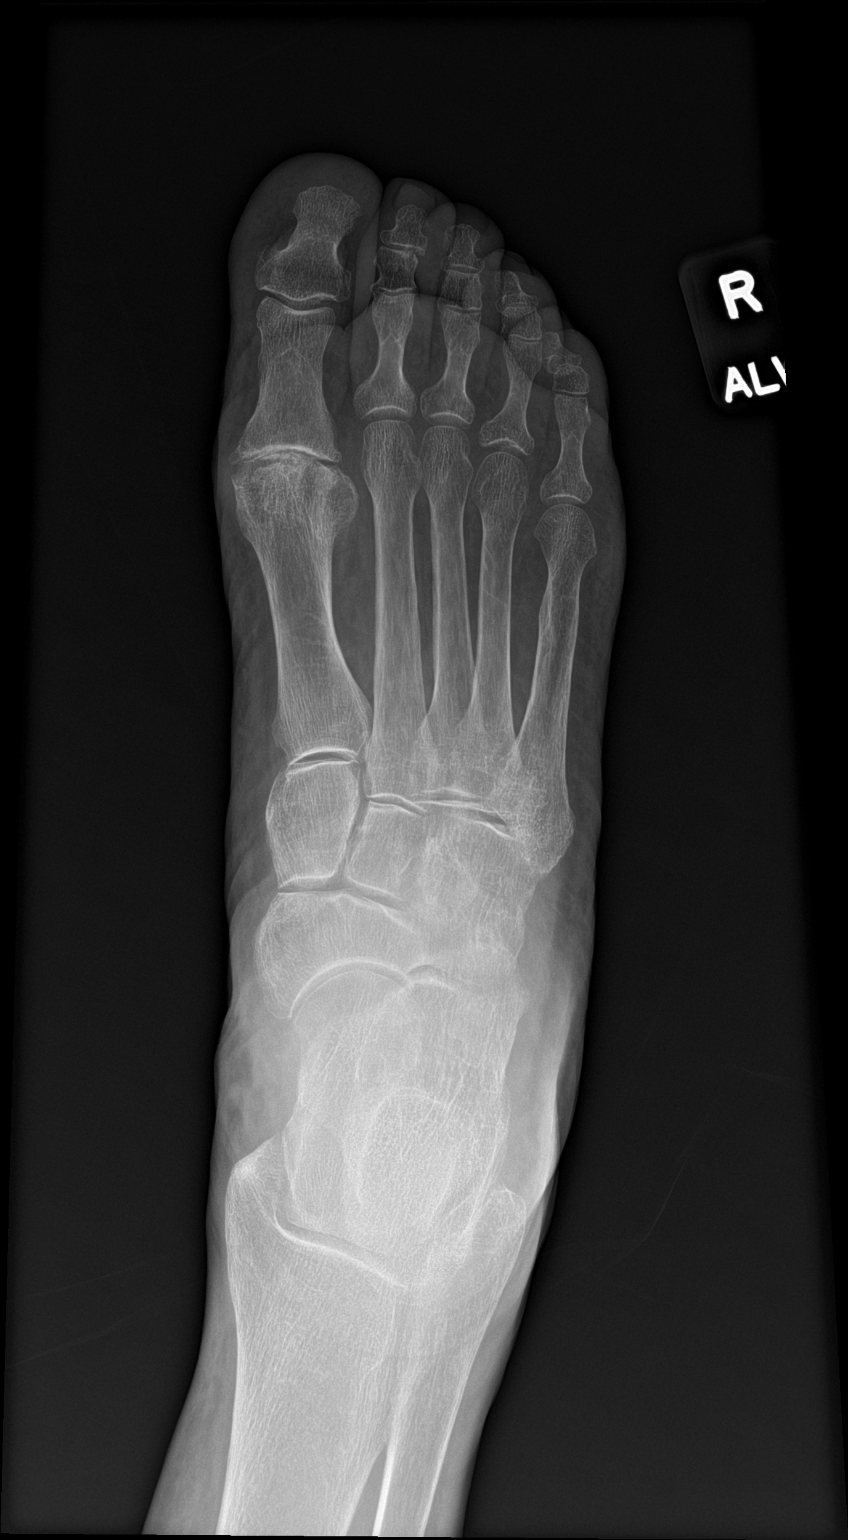

[foot obl]
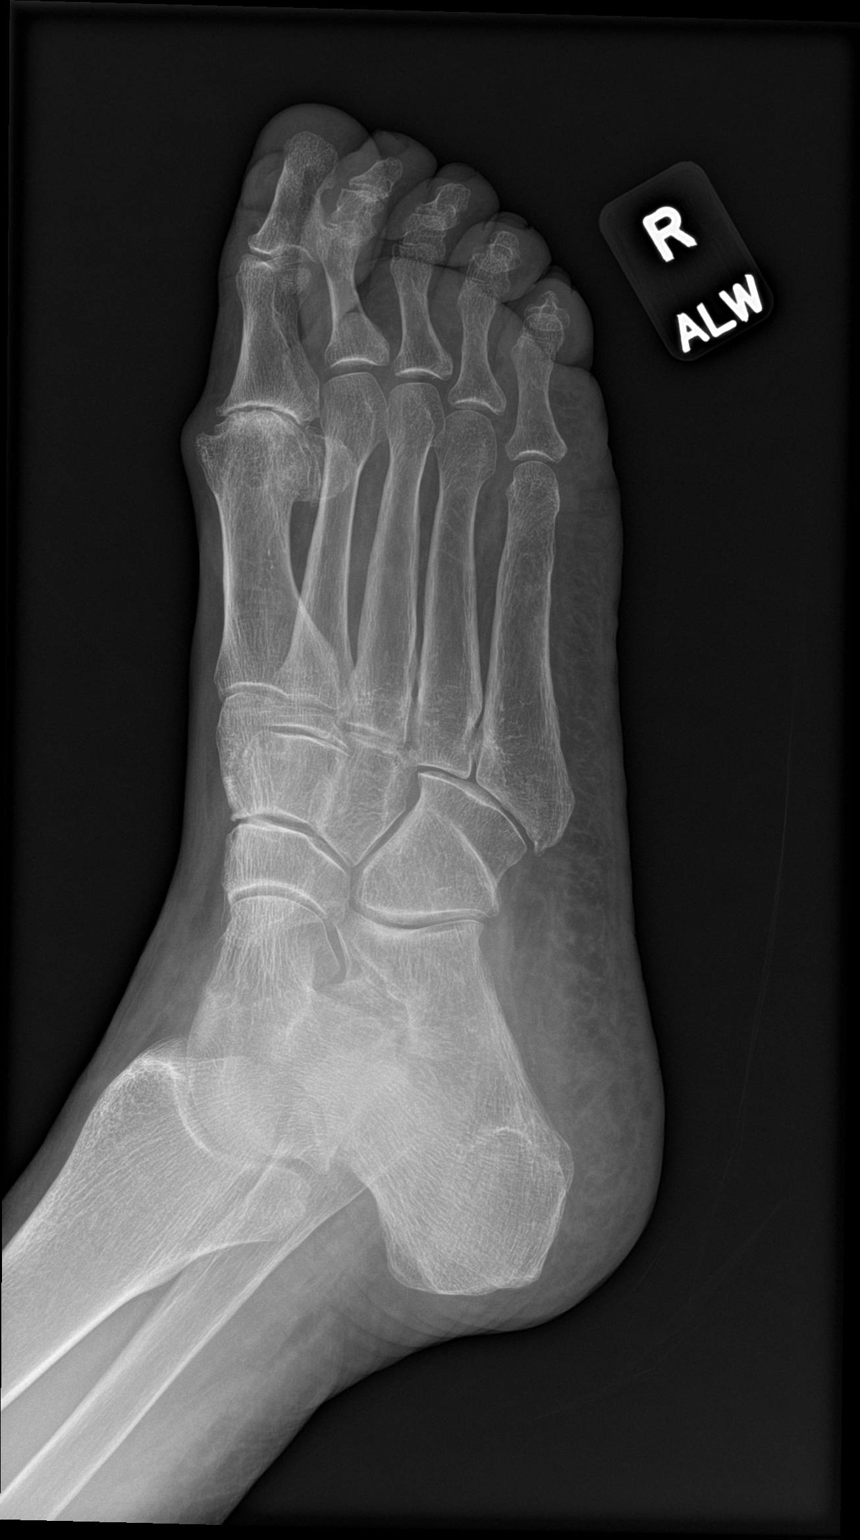

[foot lat]
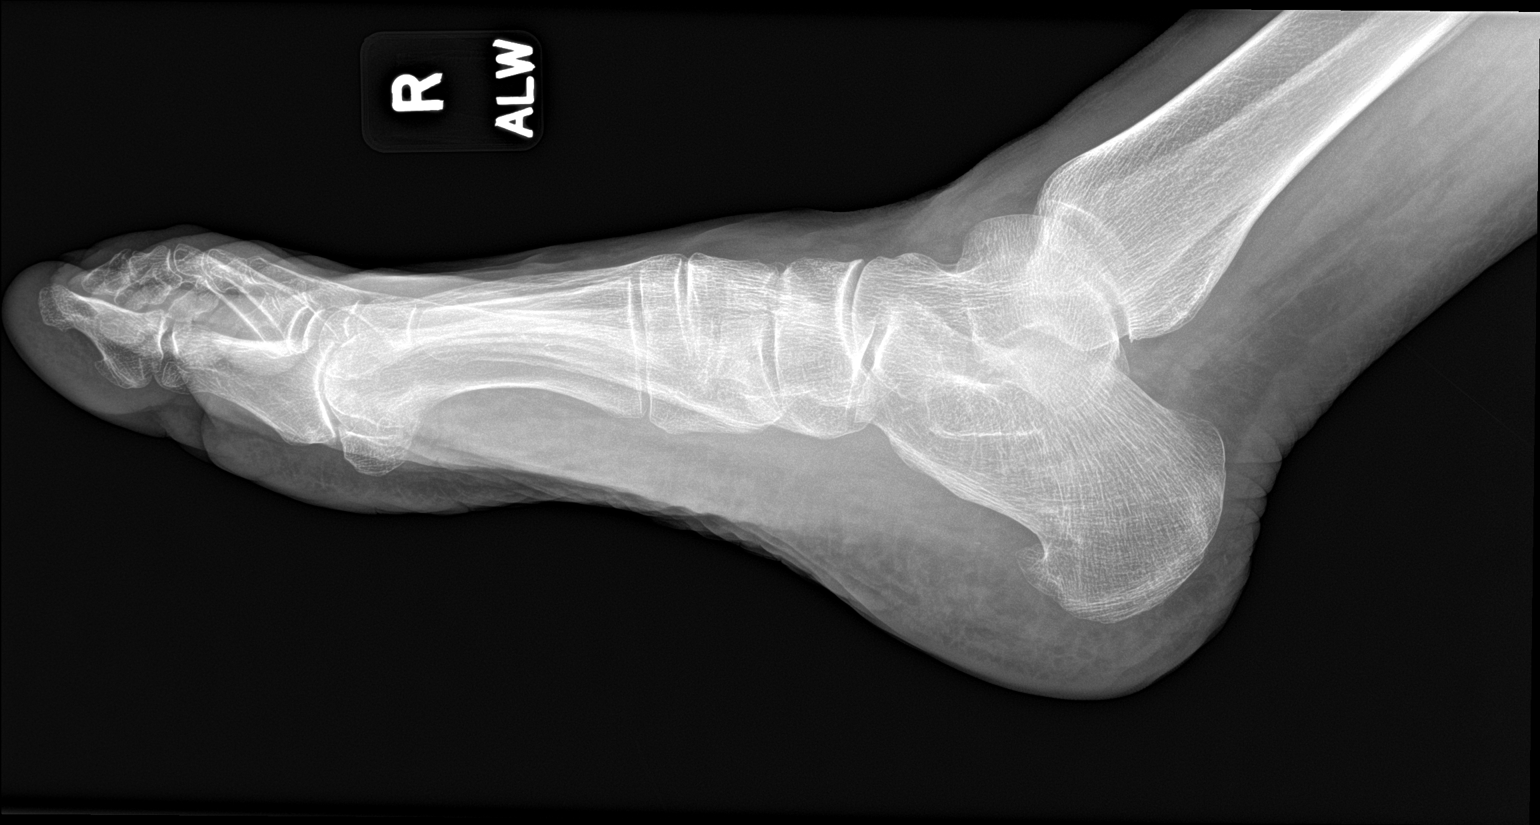

[3 of 3 positions shown; findings below may reference images not displayed]

FINDINGS: There is no evidence of fracture or dislocation. Severe degenerative
changes seen involving the first tarsometatarsal joint. Soft tissues
are unremarkable.
IMPRESSION: Severe degenerative joint disease involving first tarsometatarsal
joint. No acute abnormality seen.

## 2023-09-06 ENCOUNTER — Ambulatory Visit (INDEPENDENT_AMBULATORY_CARE_PROVIDER_SITE_OTHER): Payer: Medicare HMO | Admitting: Otolaryngology

## 2023-11-23 ENCOUNTER — Other Ambulatory Visit: Payer: Self-pay

## 2023-11-23 MED ORDER — ISOSORBIDE MONONITRATE ER 30 MG PO TB24: 15.0000 mg | ORAL_TABLET | Freq: Every day | ORAL | 1 refills | Status: AC

## 2023-12-03 IMAGING — DX DG CHEST 2V
2 series · 2 of 2 positions shown · non-contrast
Comparison: November 27, 2020

CLINICAL DATA: Cough with shortness of breath, fever and fatigue.

EXAM:
CHEST - 2 VIEW

[chest pa]
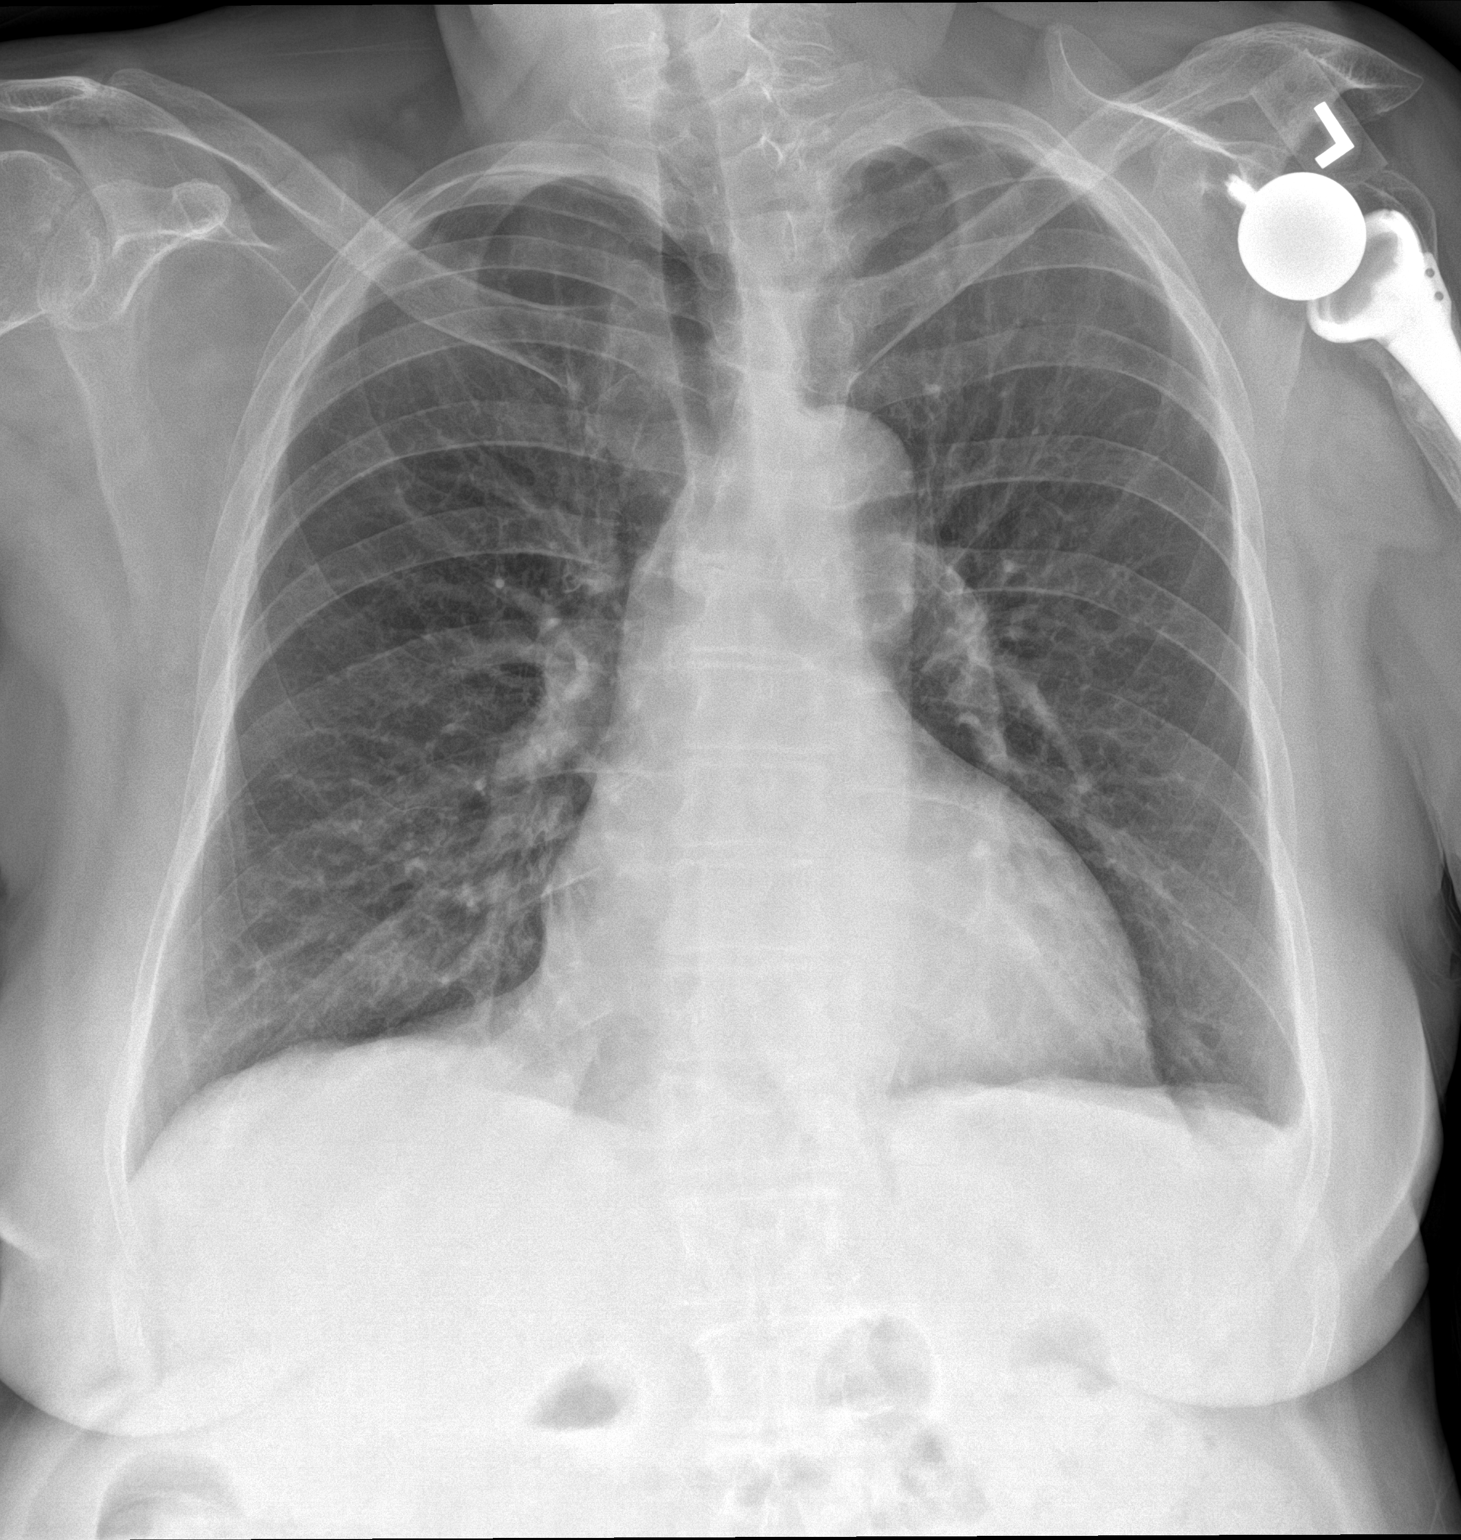

[chest lat]
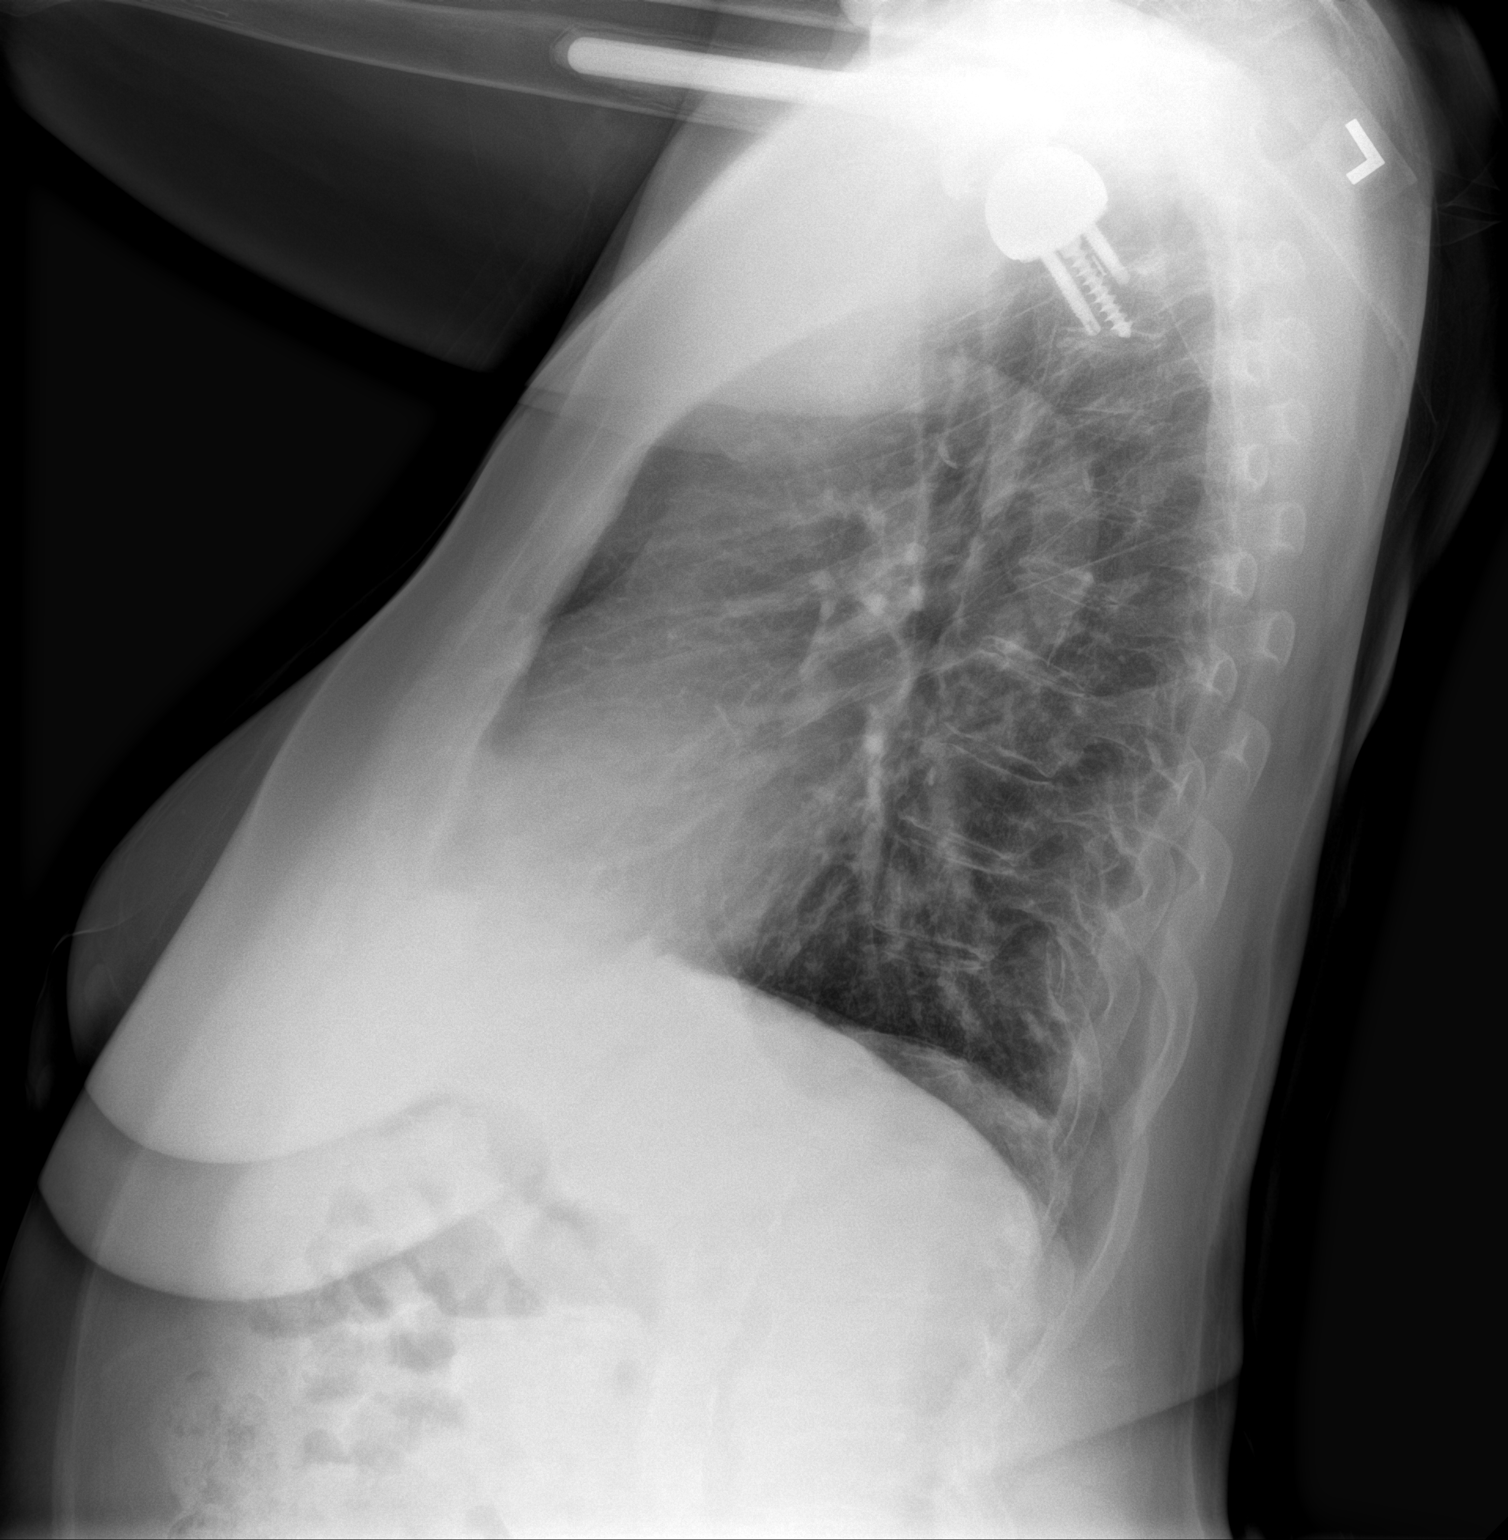

[2 of 2 positions shown; findings below may reference images not displayed]

FINDINGS: The heart size and mediastinal contours are within normal limits.
There is no evidence of acute infiltrate or pneumothorax. Very mild
blunting of the left costophrenic angle is seen. This represents a
new finding when compared to the prior study. An intact left
shoulder replacement is noted. The visualized skeletal structures
are otherwise unremarkable.
IMPRESSION: Very small left pleural effusion versus pleural thickening.

## 2024-02-09 ENCOUNTER — Other Ambulatory Visit: Payer: Self-pay

## 2024-02-09 ENCOUNTER — Emergency Department (HOSPITAL_COMMUNITY)
Admission: EM | Admit: 2024-02-09 | Discharge: 2024-02-10 | Disposition: A | Discharge status: Home / Self Care | Attending: Emergency Medicine | Admitting: Emergency Medicine

## 2024-02-09 ENCOUNTER — Emergency Department (HOSPITAL_COMMUNITY)

## 2024-02-09 DIAGNOSIS — Z79890 Hormone replacement therapy (postmenopausal): Secondary | ICD-10-CM | POA: Diagnosis not present

## 2024-02-09 DIAGNOSIS — J449 Chronic obstructive pulmonary disease, unspecified: Secondary | ICD-10-CM | POA: Diagnosis not present

## 2024-02-09 DIAGNOSIS — R079 Chest pain, unspecified: Secondary | ICD-10-CM | POA: Insufficient documentation

## 2024-02-09 DIAGNOSIS — N189 Chronic kidney disease, unspecified: Secondary | ICD-10-CM | POA: Insufficient documentation

## 2024-02-09 DIAGNOSIS — I110 Hypertensive heart disease with heart failure: Secondary | ICD-10-CM | POA: Diagnosis not present

## 2024-02-09 DIAGNOSIS — Z7982 Long term (current) use of aspirin: Secondary | ICD-10-CM | POA: Insufficient documentation

## 2024-02-09 DIAGNOSIS — I130 Hypertensive heart and chronic kidney disease with heart failure and stage 1 through stage 4 chronic kidney disease, or unspecified chronic kidney disease: Secondary | ICD-10-CM | POA: Insufficient documentation

## 2024-02-09 DIAGNOSIS — I509 Heart failure, unspecified: Secondary | ICD-10-CM | POA: Insufficient documentation

## 2024-02-09 LAB — CBC
HCT: 38.8 % (ref 36.0–46.0)
Hemoglobin: 12.5 g/dL (ref 12.0–15.0)
MCH: 29 pg (ref 26.0–34.0)
MCHC: 32.2 g/dL (ref 30.0–36.0)
MCV: 90 fL (ref 80.0–100.0)
Platelets: 308 K/uL (ref 150–400)
RBC: 4.31 MIL/uL (ref 3.87–5.11)
RDW: 13.1 % (ref 11.5–15.5)
WBC: 15.1 K/uL — ABNORMAL HIGH (ref 4.0–10.5)
nRBC: 0 % (ref 0.0–0.2)

## 2024-02-09 NOTE — ED Triage Notes (Signed)
 Patient arrived with EMS from home reports central chest pain with mild SOB and emesis this evening , received 4 ASA tabs , 1 NTG sl and Zofran 4 mg IV prior to arrival .

## 2024-02-10 ENCOUNTER — Emergency Department (HOSPITAL_COMMUNITY)

## 2024-02-10 LAB — BASIC METABOLIC PANEL WITH GFR
Anion gap: 12 (ref 5–15)
BUN: 10 mg/dL (ref 8–23)
CO2: 21 mmol/L — ABNORMAL LOW (ref 22–32)
Calcium: 8.5 mg/dL — ABNORMAL LOW (ref 8.9–10.3)
Chloride: 105 mmol/L (ref 98–111)
Creatinine, Ser: 0.9 mg/dL (ref 0.44–1.00)
GFR, Estimated: >60 mL/min (ref >60)
Glucose, Bld: 196 mg/dL — ABNORMAL HIGH (ref 70–99)
Potassium: 3.5 mmol/L (ref 3.5–5.1)
Sodium: 138 mmol/L (ref 135–145)

## 2024-02-10 LAB — PROTIME-INR
INR: 1 (ref 0.8–1.2)
Prothrombin Time: 14.2 s (ref 11.4–15.2)

## 2024-02-10 LAB — BRAIN NATRIURETIC PEPTIDE: B Natriuretic Peptide: 59.2 pg/mL (ref 0.0–100.0)

## 2024-02-10 LAB — TROPONIN I (HIGH SENSITIVITY)
Troponin I (High Sensitivity): 3 ng/L (ref <18)
Troponin I (High Sensitivity): 4 ng/L (ref <18)
Troponin I (High Sensitivity): 5 ng/L (ref <18)

## 2024-02-10 LAB — MAGNESIUM: Magnesium: 1.8 mg/dL (ref 1.7–2.4)

## 2024-02-10 MED ORDER — ONDANSETRON HCL 4 MG/2ML IJ SOLN
4.0000 mg | Freq: Once | INTRAMUSCULAR | Status: AC
  Administered 2024-02-10: 4 mg via INTRAVENOUS
  Filled 2024-02-10: qty 2

## 2024-02-10 MED ORDER — LACTATED RINGERS IV BOLUS
500.0000 mL | Freq: Once | INTRAVENOUS | Status: AC
  Administered 2024-02-10: 500 mL via INTRAVENOUS

## 2024-02-10 MED ORDER — NITROGLYCERIN 0.4 MG SL SUBL
0.4000 mg | SUBLINGUAL_TABLET | SUBLINGUAL | Status: DC | PRN
  Administered 2024-02-10: 0.4 mg via SUBLINGUAL
  Filled 2024-02-10: qty 1

## 2024-02-10 NOTE — ED Provider Triage Note (Signed)
 Emergency Medicine Provider Triage Evaluation Note  Kayla Solomon , a 73 y.o. female  was evaluated in triage.  Pt complains of chest pain.  Pain started at 8 PM.  Pain is described as a pressure in the central left side of her chest.  She  endorses associated shortness of breath, nausea, vomiting.  EMS administered 324 mg of aspirin, 1 dose of nitroglycerin , Zofran.  Patient states that the nitroglycerin  did not seem to help her symptoms  Review of Systems  Positive:  Negative:   Physical Exam  BP (!) 132/56 (BP Location: Left Arm)   Pulse 76   Temp 98.7 F (37.1 C) (Oral)   Resp 16   SpO2 95%  Gen:   Awake, no distress   Resp:  Normal effort  MSK:   Moves extremities without difficulty  Other:    Medical Decision Making  Medically screening exam initiated at 12:31 AM.  Appropriate orders placed.  Kayla Solomon was informed that the remainder of the evaluation will be completed by another provider, this initial triage assessment does not replace that evaluation, and the importance of remaining in the ED until their evaluation is complete.     Logan Ubaldo NOVAK, PA-C 02/10/24 [phone]

## 2024-02-10 NOTE — ED Notes (Signed)
 Pt currently in bathroom.  KM

## 2024-02-10 NOTE — ED Provider Notes (Signed)
 [facility] Provider Note   CSN: [id number] Arrival date & time: 02/09/24  2302     History Chief Complaint  Patient presents with   Chest Pain   Shortness of Breath   HPI: Kayla Solomon is a 73 y.o. 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 Patient arrived via EMS from home.  History provided by patient.  No interpreter required during this encounter.  Patient reports that she began having chest pain while at rest in bed last night.  Reports that the pain feels like a substernal pressure, no specific aggravating or alleviating factors.  It is associated with mild shortness of breath.  Reports that the shortness of breath has resolved, however the chest pain is persistent.  Reports that with EMS she did receive nitroglycerin , aspirin, Zofran, and reports that her symptoms partially improved with this, however since have returned.  Notably, this history differs from MSE note where patient reportedly told that provider that nitroglycerin  did not help her symptoms.  Denies any fever, chills, nausea, vomiting, emesis, diaphoresis, lower extremity swelling.  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  Patient denies numbness, weakness, tingling  Patient's recorded medical, surgical, social, medication list and allergies were reviewed in the Snapshot window as part of the initial history.   Prior to Admission medications   Medication Sig Start Date End Date Taking? Authorizing Provider  albuterol  (VENTOLIN  HFA) 108 (90 Base) MCG/ACT inhaler INHALE 1 PUFF BY MOUTH THREE TIMES DAILY AS NEEDED FOR 30 DAYS    [provider]  ALPRAZolam (XANAX) 1 MG tablet     [provider]  amLODipine (NORVASC) 10 MG  tablet Take 10 mg by mouth daily. 12/16/21   [provider]  Ascorbic Acid (VITAMIN C) 1000 MG tablet Take 1,000 mg by mouth daily.    [provider]  atorvastatin (LIPITOR) 20 MG tablet Take 20 mg by mouth daily.    [provider]  Biotin 89999 MCG TABS Take 1 capsule by mouth daily.    [provider]  co-enzyme Q-10 30 MG capsule Take 30 mg by mouth 2 (two) times daily.    [provider]  Cobalamin Combinations (B-12) [phone] MCG SUBL Take 5,000 mcg by mouth daily. 2 daily    [provider]  desmopressin (DDAVP) 0.1 MG tablet Take 0.1 mg by mouth 2 (two) times daily.    [provider]  ferrous sulfate 325 (65 FE) MG tablet Take 325 mg by mouth daily with breakfast.    [provider]  FLUoxetine (PROZAC) 40 MG capsule Take 40 mg by mouth daily.    [provider]  fluticasone OREN) 50 MCG/ACT nasal spray     [provider]  Fluticasone-Umeclidin-Vilant (TRELEGY ELLIPTA ) 100-62.5-25 MCG/ACT AEPB Inhale 1 puff into the lungs daily. Patient not taking: Reported on 04/25/2023 11/17/22   Parrett, Madelin RAMAN, NP  Fluticasone-Umeclidin-Vilant (TRELEGY ELLIPTA ) 100-62.5-25 MCG/ACT AEPB Inhale 1 puff into the lungs daily. 03/21/23   Darlean Ozell NOVAK, MD  isosorbide  mononitrate (IMDUR ) 30 MG 24 hr tablet Take 0.5 tablets (15 mg total) by mouth at bedtime. 11/23/23   Mona Vinie BROCKS, MD  levothyroxine (SYNTHROID) 112 MCG tablet Take 112 mcg by mouth daily before breakfast.    [provider]  meclizine (ANTIVERT) 12.5 MG tablet Take 12.5 mg by mouth 3 (three) times daily. 08/23/21   [provider]  montelukast (SINGULAIR) 10 MG tablet Take 10 mg by mouth at bedtime.    [provider]  nitroGLYCERIN  (NITROSTAT ) 0.4 MG SL tablet Place 1 tablet (0.4 mg total) under the tongue every 5 (five) minutes as needed for chest pain. 04/25/23   [facility], Jon Garre, PA  pantoprazole (PROTONIX) 40 MG  tablet Take 40 mg by mouth daily.    [provider]  tizanidine (ZANAFLEX) 2 MG capsule Take 2 mg by mouth daily.    [provider]  traZODone (DESYREL) 100 MG tablet Take 100 mg by mouth at bedtime.    [provider]  TRELEGY ELLIPTA  100-62.5-25 MCG/ACT AEPB Inhale 1 puff into the lungs daily. Patient not taking: Reported on 04/25/2023 11/17/22   Parrett, Madelin RAMAN, NP  vitamin E 180 MG (400 UNITS) capsule Take 400 Units by mouth daily. 450 mg (1,000 unit) capsule    [provider]     Allergies: Patient has no known allergies.   Review of Systems   ROS as per HPI  Physical Exam Updated Vital Signs BP (!) 146/60   Pulse 84   Temp 98.4 F (36.9 C) (Oral)   Resp (!) 23   Ht 5' 5 (1.651 m)   Wt 83.9 kg   SpO2 98%   BMI 30.78 kg/m  Physical Exam Vitals and nursing note reviewed.  Constitutional:      General: She is not in acute distress.    Appearance: She is well-developed.  HENT:     Head: Normocephalic and atraumatic.  Eyes:     Conjunctiva/sclera: Conjunctivae normal.  Cardiovascular:     Rate and Rhythm: Normal rate and regular rhythm.     Heart sounds: No murmur heard. Pulmonary:     Effort: Pulmonary effort is normal. No respiratory distress.     Breath sounds: Normal breath sounds.  Abdominal:     Palpations: Abdomen is soft.     Tenderness: There is no abdominal tenderness.  Musculoskeletal:        General: No swelling.     Cervical back: Neck supple.     Right lower leg: No tenderness. No edema.     Left lower leg: No tenderness. No edema.  Skin:    General: Skin is warm and dry.     Capillary Refill: Capillary refill takes less than 2 seconds.  Neurological:     Mental Status: She is alert.     Comments: Aphasia at baseline per patient  Psychiatric:        Mood and Affect: Mood normal.     ED Course/ Medical Decision Making/ A&P    Procedures Procedures   Medications Ordered in ED Medications   ondansetron (ZOFRAN) injection 4 mg (4 mg Intravenous Given 02/10/24 0854)  lactated ringers bolus 500 mL (0 mLs Intravenous Stopped 02/10/24 1050)    Medical Decision Making:   Kayla Solomon is a 73 y.o. female who presents for as per above.  Physical exam is pertinent for mild aphasia which patient reports is baseline, no other focal abnormalities.   The differential includes but is not limited to ACS, arrhythmia, pericardial tamponade, pericarditis, myocarditis, pneumonia, pneumothorax, esophageal, tear, perforated abdominal viscous, pulmonary embolism, aortic dissection, costochondritis, musculoskeletal chest wall pain, GERD.  Independent historian: None  External data reviewed: No pertinent external data  Labs: Ordered, Independent interpretation, and Details:  CBC with mild nonspecific leukocytosis to 15.1.  No anemia or thrombocytopenia.  BMP without AKI or emergent electrolyte derangement.  PT/INR WNL.  BNP WNL. Initial troponin WNL at 5, delta 3  Radiology: Ordered, Independent interpretation, Details: Chest x-ray without focal airspace opacification, cardiomediastinal switcher in short, pneumothorax, pleural effusion, bony derangement, and All images reviewed independently.  Agree with radiology report at this time.   DG Chest 2 View Result Date: 02/10/2024 CLINICAL DATA:  Chest pain. EXAM: CHEST - 2 VIEW COMPARISON:  02/09/2024.  Chest CT dated 08/23/2022. FINDINGS: Stable mildly enlarged cardiac silhouette, accentuated by poor inspiration. Tortuous and partially calcified thoracic aorta. Mild left lower lobe linear atelectasis/scarring with mild progression medially and improvement laterally. Otherwise, clear lungs with normal vascularity. Moderate-sized hiatal hernia, seen better on the previous CT. Diffuse osteopenia. Left shoulder prosthesis. IMPRESSION: 1. Mild left lower lobe linear atelectasis/scarring. 2. Stable mild cardiomegaly. 3. Moderate-sized hiatal hernia. Electronically  Signed   By: Elspeth Bathe M.D.   On: 02/10/2024 11:49    EKG/Medicine tests: Ordered and Independent interpretation EKG Interpretation Date/Time:  Saturday February 10 2024 09:04:48 EDT Ventricular Rate:  84 PR Interval:  149 QRS Duration:  99 QT Interval:  427 QTC Calculation: 505 R Axis:   42  Text Interpretation: Sinus rhythm, anterior T wave inversions which have been previously demonstrated on prior EKG from December 2024                Interventions: Zofran, nitroglycerin , LR bolus  See the EMR for full details regarding lab and imaging results.  The ECG reveals no anatomical ischemia representing STEMI, New-Onset Arrhythmia, or ischemic equivalent. She has been risk stratified with a HEAR score of 6. Initial troponin is 5; delta troponin is 3.  The patient's presentation, the patient being hemodynamically stable, and the ECG are not consistent with Pericardial Tamponade. The patient's pain is not positional. This in conjunction with the lack of PR depressions and ST elevations on the ECG are reassuring against Pericarditis. The patient's non-elevated troponin and ECG are also inconsistent with Myocarditis.  The CXR is unremarkable for focal airspace disease.  The patient is afebrile and denies productive cough.  Therefore, I do not suspect Pneumonia. There is no evidence of Pneumothorax on physical exam or on the CXR. CXR shows no evidence of Esophageal Tear and there is no recent intractable emesis or esophageal instrumentation. There is no peritonitis or free air on CXR worrisome for a Perforated Abdominal Viscous.  The patient's pain is not tearing and it does not radiate to back. Pulses are present bilaterally in both the upper and lower extremities. CXR does not show a widened mediastinum. I have a very low suspicion for Aortic Dissection.  Given that on my initial evaluation, patient reports that she has ongoing chest pain, and that yesterday when she was administered  nitroglycerin  her chest pain improved, will administer additional nitroglycerin , Zofran, fluids, and then obtain repeat EKG.  These were reassuring, patient had resolution of chest pain.  Patient was monitored for prolonged period in the emergency department and did not have recurrent chest pain.  Given reassuring workup, resolution of chest pain, feel that patient is appropriate for discharge and outpatient follow-up, as well as referral to cardiology.  Patient is comfortable this plan, discharged in stable condition.  Presentation is most consistent with acute complicated illness and I did consider and rule out acute life/limb-threatening illness  Discussion of management or test interpretations with external provider(s): Not indicated  Risk Drugs:Prescription drug management  Disposition: DISCHARGE: I believe that the patient is safe for discharge home with outpatient follow-up. Patient was informed of all pertinent physical exam, laboratory, and imaging findings. Patient's suspected etiology of their symptom presentation was discussed with the patient and all questions were answered. We discussed following up with PCP, cardiology. I provided thorough ED return precautions. The patient feels safe and comfortable with this plan.  MDM generated using voice dictation software and may contain dictation errors.  Please contact me for any clarification or with any questions.  Clinical Impression:  1. Chest pain, unspecified type      Discharge   Final Clinical Impression(s) / ED Diagnoses Final diagnoses:  Chest pain, unspecified type    Rx / DC Orders ED Discharge Orders          Ordered    Ambulatory referral to Cardiology       Comments: If you have not heard from the Cardiology office within the next 72 hours please call [phone].   02/10/24 1310             Rogelia Jerilynn RAMAN, MD 02/18/24 1945

## 2024-02-10 NOTE — ED Notes (Signed)
 Patient seen and examined by PA at triage ems , can wait at waiting area .

## 2024-02-10 NOTE — Discharge Instructions (Signed)
 Kayla Solomon  Thank you for allowing us  to take care of you today.  You came to the Emergency Department today because you developed chest pain last night.  Initially your chest pain recently leapt with nitroglycerin , however it came back, again got better with nitroglycerin , and did not return while you are in the emergency department.  Your labs show that you are not currently having a heart attack, your chest x-ray is reassuring.  Your symptoms are likely from underlying heart disease, however given you are currently symptom-free, you are stable to follow-up outpatient with your cardiologist.  If you have recurrent chest pain, that would be reason to come back to the emergency department.  To-Do: 1. Please follow-up with your primary doctor within 1 - 2 weeks / as soon as possible.   Please return to the Emergency Department or call 911 if you experience have worsening of your symptoms, or do not get better, chest pain, shortness of breath, severe or significantly worsening pain, high fever, severe confusion, pass out or have any reason to think that you need emergency medical care.   We hope you feel better soon.   [name], MD Department of Emergency Medicine [facility]

## 2024-02-21 ENCOUNTER — Telehealth: Payer: Self-pay | Admitting: Internal Medicine

## 2024-02-21 NOTE — Telephone Encounter (Signed)
 Pt would like to switch providers from [facility] to [facility]. Please advise.

## 2024-02-27 ENCOUNTER — Ambulatory Visit
Admission: RE | Admit: 2024-02-27 | Discharge: 2024-02-27 | Disposition: A | Source: Ambulatory Visit | Attending: Adult Health Nurse Practitioner | Admitting: Adult Health Nurse Practitioner

## 2024-02-27 ENCOUNTER — Other Ambulatory Visit: Payer: Self-pay | Admitting: Adult Health Nurse Practitioner

## 2024-02-27 DIAGNOSIS — M545 Low back pain, unspecified: Secondary | ICD-10-CM
